# Patient Record
Sex: Male | Born: 1995 | Race: Black or African American | Marital: Single | Smoking: Never smoker
Health system: Southern US, Community
[De-identification: ages and names within clinical notes are randomized; demographics above are authoritative.]

## PROBLEM LIST (undated history)

## (undated) DIAGNOSIS — I10 Essential (primary) hypertension: Secondary | ICD-10-CM

---
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
## 2018-10-25 DIAGNOSIS — S62605A Fracture of unspecified phalanx of left ring finger, initial encounter for closed fracture: Secondary | ICD-10-CM | POA: Diagnosis not present

## 2018-10-29 DIAGNOSIS — S62605A Fracture of unspecified phalanx of left ring finger, initial encounter for closed fracture: Secondary | ICD-10-CM | POA: Diagnosis not present

## 2018-10-30 ENCOUNTER — Encounter: Payer: Self-pay | Admitting: Orthopedic Surgery

## 2018-10-30 ENCOUNTER — Other Ambulatory Visit: Payer: Self-pay

## 2018-10-30 ENCOUNTER — Ambulatory Visit (INDEPENDENT_AMBULATORY_CARE_PROVIDER_SITE_OTHER): Payer: Medicaid Other

## 2018-10-30 ENCOUNTER — Ambulatory Visit (INDEPENDENT_AMBULATORY_CARE_PROVIDER_SITE_OTHER): Payer: Medicaid Other | Admitting: Orthopedic Surgery

## 2018-10-30 VITALS — Temp 97.2°F | Resp 18 | Ht 68.0 in | Wt 277.0 lb

## 2018-10-30 DIAGNOSIS — S62655A Nondisplaced fracture of medial phalanx of left ring finger, initial encounter for closed fracture: Secondary | ICD-10-CM

## 2018-10-30 DIAGNOSIS — M79645 Pain in left finger(s): Secondary | ICD-10-CM | POA: Diagnosis not present

## 2018-10-30 NOTE — Patient Instructions (Addendum)
You will be scheduled to see a hand specialist for further management of this injury For pain you can use Tylenol or Advil over-the-counter Aleve is also appropriate

## 2018-10-30 NOTE — Progress Notes (Signed)
Jeremy Hughes  10/30/2018  HISTORY SECTION :  Chief Complaint  Patient presents with  . Finger Injury    left ring finger injury    The patient presents for evaluation of left ring finger injury approximately 4 weeks ago  The patient was playing basketball jammed his left ring finger presented for evaluation around 17 July however comes in for what was diagnosed as a middle phalanx fracture volar plate complaining of pain swelling and stiffness  Location left ring finger PIP joint Duration 1 month Quality dull Severity mild Associated with decreased extension pain at the PIP joint swelling of the PIP joint    Review of Systems  Constitutional: Positive for chills, fever and malaise/fatigue.  HENT: Positive for sore throat.   Respiratory: Positive for cough.   Neurological: Positive for dizziness and tingling.  All other systems reviewed and are negative.    History reviewed. No pertinent past medical history. No major medical problems History reviewed. No pertinent surgical history. No prior surgery  No Known Allergies   Current Outpatient Medications:  .  ibuprofen (ADVIL,MOTRIN) 600 MG tablet, Take 1 tablet (600 mg total) by mouth every 6 (six) hours as needed. (Patient not taking: Reported on 10/30/2018), Disp: 30 tablet, Rfl: 0   PHYSICAL EXAM SECTION: 1) Resp 18   Ht 5\' 8"  (1.727 m)   Wt 277 lb (125.6 kg)   BMI 42.12 kg/m   Body mass index is 42.12 kg/m. General appearance: Well-developed well-nourished no gross deformities  2) Cardiovascular normal pulse and perfusion  normal color without edema  3) Neurologically deep tendon reflexes are equal and normal, no sensation loss or deficits no pathologic reflexes  4) Psychological: Awake alert and oriented x3 mood and affect normal  5) Skin no lacerations or ulcerations no nodularity no palpable masses, no erythema or nodularity  6) Musculoskeletal:   Left ring finger is swollen at the PIP joint he is  lacking 30 degrees of extension he can flex it all the way but cannot make a tight fist is stable medial lateral anterior posterior is extension strength and flexion strength in terms of tendon function normal   MEDICAL DECISION SECTION:  Encounter Diagnosis  Name Primary?  . Pain in left finger(s) Yes    Imaging CD images are poor quality report was reviewed and was read as a volar plate injury of the PIP joint at the middle phalanx with surrounding soft tissue swelling  I repeated the x-ray fracture fragment is nondisplaced  Plan:  (Rx., Inj., surg., Frx, MRI/CT, XR:2)  Possible boutonniere type injury with central slip injury  Recommend referral to hand specialist   9:58 AM

## 2018-11-07 DIAGNOSIS — M25642 Stiffness of left hand, not elsewhere classified: Secondary | ICD-10-CM | POA: Diagnosis not present

## 2018-11-07 DIAGNOSIS — M79645 Pain in left finger(s): Secondary | ICD-10-CM | POA: Diagnosis not present

## 2018-11-07 DIAGNOSIS — S62625A Displaced fracture of medial phalanx of left ring finger, initial encounter for closed fracture: Secondary | ICD-10-CM | POA: Diagnosis not present

## 2018-11-13 DIAGNOSIS — M79645 Pain in left finger(s): Secondary | ICD-10-CM | POA: Diagnosis not present

## 2018-11-13 DIAGNOSIS — M25642 Stiffness of left hand, not elsewhere classified: Secondary | ICD-10-CM | POA: Diagnosis not present

## 2018-11-13 DIAGNOSIS — S62625A Displaced fracture of medial phalanx of left ring finger, initial encounter for closed fracture: Secondary | ICD-10-CM | POA: Diagnosis not present

## 2018-11-15 ENCOUNTER — Encounter: Payer: Self-pay | Admitting: Orthopedic Surgery

## 2018-11-27 ENCOUNTER — Ambulatory Visit: Payer: Medicaid Other | Admitting: Orthopedic Surgery

## 2018-11-27 DIAGNOSIS — M25642 Stiffness of left hand, not elsewhere classified: Secondary | ICD-10-CM | POA: Diagnosis not present

## 2018-11-27 DIAGNOSIS — M79645 Pain in left finger(s): Secondary | ICD-10-CM | POA: Diagnosis not present

## 2018-11-27 DIAGNOSIS — S62625A Displaced fracture of medial phalanx of left ring finger, initial encounter for closed fracture: Secondary | ICD-10-CM | POA: Diagnosis not present

## 2018-12-04 ENCOUNTER — Ambulatory Visit: Payer: Medicaid Other | Admitting: Orthopedic Surgery

## 2018-12-11 DIAGNOSIS — M25642 Stiffness of left hand, not elsewhere classified: Secondary | ICD-10-CM | POA: Diagnosis not present

## 2018-12-25 DIAGNOSIS — M25642 Stiffness of left hand, not elsewhere classified: Secondary | ICD-10-CM | POA: Diagnosis not present

## 2018-12-25 DIAGNOSIS — M79645 Pain in left finger(s): Secondary | ICD-10-CM | POA: Diagnosis not present

## 2018-12-25 DIAGNOSIS — S62625A Displaced fracture of medial phalanx of left ring finger, initial encounter for closed fracture: Secondary | ICD-10-CM | POA: Diagnosis not present

## 2018-12-30 DIAGNOSIS — M79645 Pain in left finger(s): Secondary | ICD-10-CM | POA: Diagnosis not present

## 2018-12-30 DIAGNOSIS — S62625A Displaced fracture of medial phalanx of left ring finger, initial encounter for closed fracture: Secondary | ICD-10-CM | POA: Diagnosis not present

## 2018-12-30 DIAGNOSIS — M25642 Stiffness of left hand, not elsewhere classified: Secondary | ICD-10-CM | POA: Diagnosis not present

## 2019-01-30 DIAGNOSIS — S62625A Displaced fracture of medial phalanx of left ring finger, initial encounter for closed fracture: Secondary | ICD-10-CM | POA: Diagnosis not present

## 2019-01-30 DIAGNOSIS — M79645 Pain in left finger(s): Secondary | ICD-10-CM | POA: Diagnosis not present

## 2019-04-18 ENCOUNTER — Ambulatory Visit
Admission: EM | Admit: 2019-04-18 | Discharge: 2019-04-18 | Disposition: A | Discharge status: Home / Self Care | Payer: Medicaid Other | Attending: Emergency Medicine | Admitting: Emergency Medicine

## 2019-04-18 ENCOUNTER — Other Ambulatory Visit: Payer: Self-pay

## 2019-04-18 DIAGNOSIS — Z0189 Encounter for other specified special examinations: Secondary | ICD-10-CM

## 2019-04-18 NOTE — Discharge Instructions (Signed)

## 2019-04-18 NOTE — ED Provider Notes (Signed)
[facility] CARE CENTER   [id number] 04/18/19 Arrival Time: 0941   CC: COVID test  SUBJECTIVE: History from: patient.  Vikram I Kovaleski is a 24 y.o. male who presents for COVID testing.  Denies sick exposure to COVID, flu or strep.  Denies recent travel.  Denies aggravating or alleviating symptoms.  Denies previous COVID infection.   Denies fever, chills, fatigue, nasal congestion, rhinorrhea, sore throat, cough, SOB, wheezing, chest pain, nausea, vomiting, changes in bowel or bladder habits.    ROS: As per HPI.  All other pertinent ROS negative.     History reviewed. No pertinent past medical history. History reviewed. No pertinent surgical history. No Known Allergies No current facility-administered medications on file prior to encounter.   No current outpatient medications on file prior to encounter.   Social History   Socioeconomic History  . Marital status: Single    Spouse name: Not on file  . Number of children: Not on file  . Years of education: Not on file  . Highest education level: Not on file  Occupational History  . Not on file  Tobacco Use  . Smoking status: Never Smoker  . Smokeless tobacco: Never Used  Substance and Sexual Activity  . Alcohol use: No  . Drug use: No  . Sexual activity: Not on file  Other Topics Concern  . Not on file  Social History Narrative  . Not on file   Social Determinants of Health   Financial Resource Strain:   . Difficulty of Paying Living Expenses: Not on file  Food Insecurity:   . Worried About Programme researcher, broadcasting/film/video in the Last Year: Not on file  . Ran Out of Food in the Last Year: Not on file  Transportation Needs:   . Lack of Transportation (Medical): Not on file  . Lack of Transportation (Non-Medical): Not on file  Physical Activity:   . Days of Exercise per Week: Not on file  . Minutes of Exercise per Session: Not on file  Stress:   . Feeling of Stress : Not on file  Social Connections:   . Frequency of  Communication with Friends and Family: Not on file  . Frequency of Social Gatherings with Friends and Family: Not on file  . Attends Religious Services: Not on file  . Active Member of Clubs or Organizations: Not on file  . Attends Banker Meetings: Not on file  . Marital Status: Not on file  Intimate Partner Violence:   . Fear of Current or Ex-Partner: Not on file  . Emotionally Abused: Not on file  . Physically Abused: Not on file  . Sexually Abused: Not on file   Family History  Problem Relation Age of Onset  . Healthy Mother   . Healthy Father   . Diabetes Maternal Aunt   . Hypertension Maternal Aunt   . Hypertension Maternal Grandmother   . Diabetes Maternal Grandmother   . Hypertension Maternal Grandfather     OBJECTIVE:  Vitals:   04/18/19 1001  BP: 127/87  Pulse: 91  Resp: 18  Temp: 98 F (36.7 C)  SpO2: 96%     General appearance: alert; well-appearing, nontoxic; speaking in full sentences and tolerating own secretions HEENT: NCAT; Ears: EACs clear, TMs pearly gray; Eyes: PERRL.  EOM grossly intact.  Nose: nares patent without rhinorrhea, Throat: oropharynx clear, tonsils non erythematous or enlarged, uvula midline  Neck: supple without LAD Lungs: unlabored respirations, symmetrical air entry; cough: absent; no respiratory distress; CTAB Heart:  regular rate and rhythm.   Skin: warm and dry Psychological: alert and cooperative; normal mood and affect  ASSESSMENT & PLAN:  1. Patient request for diagnostic testing     No orders of the defined types were placed in this encounter.  COVID testing ordered.  It will take between 5-7 days for test results.  Someone will contact you regarding abnormal results.    In the meantime: You should remain isolated in your home for 10 days from symptom onset AND greater than 72 hours after symptoms resolution (absence of fever without the use of fever-reducing medication and improvement in respiratory  symptoms), whichever is longer OR 14 days from exposure Get plenty of rest and push fluids Use OTC zyrtec for nasal congestion, runny nose, and/or sore throat Use OTC flonase for nasal congestion and runny nose Use medications daily for symptom relief Use OTC medications like ibuprofen or tylenol as needed fever or pain Call or go to the ED if you have any new or worsening symptoms such as fever, cough, shortness of breath, chest tightness, chest pain, turning blue, changes in mental status, etc...   Reviewed expectations re: course of current medical issues. Questions answered. Outlined signs and symptoms indicating need for more acute intervention. Patient verbalized understanding. After Visit Summary given.         Lestine Box, PA-C 04/18/19 1028

## 2019-04-18 NOTE — ED Triage Notes (Signed)
Pt presents with request for COVID test for school. Denies any symptoms or exposure to COVID. Pt denies any other complaints.

## 2019-04-20 LAB — NOVEL CORONAVIRUS, NAA

## 2019-04-21 ENCOUNTER — Ambulatory Visit
Admission: EM | Admit: 2019-04-21 | Discharge: 2019-04-21 | Disposition: A | Discharge status: Home / Self Care | Payer: Medicaid Other | Attending: Emergency Medicine | Admitting: Emergency Medicine

## 2019-04-21 ENCOUNTER — Encounter (HOSPITAL_COMMUNITY): Payer: Self-pay

## 2019-04-21 ENCOUNTER — Telehealth (HOSPITAL_COMMUNITY): Payer: Self-pay | Admitting: Emergency Medicine

## 2019-04-21 DIAGNOSIS — Z711 Person with feared health complaint in whom no diagnosis is made: Secondary | ICD-10-CM

## 2019-04-21 LAB — POC SARS CORONAVIRUS 2 AG -  ED: SARS Coronavirus 2 Ag: NEGATIVE

## 2019-04-21 NOTE — Telephone Encounter (Signed)

## 2019-04-21 NOTE — ED Triage Notes (Signed)
pts covid test inclonclusive and needs retesting for college admission tomorrow. Pt has not had symptoms

## 2019-10-15 ENCOUNTER — Encounter: Payer: Self-pay | Admitting: Emergency Medicine

## 2019-10-15 ENCOUNTER — Other Ambulatory Visit: Payer: Self-pay

## 2019-10-15 ENCOUNTER — Ambulatory Visit
Admission: EM | Admit: 2019-10-15 | Discharge: 2019-10-15 | Disposition: A | Discharge status: Home / Self Care | Payer: Medicaid Other | Attending: Emergency Medicine | Admitting: Emergency Medicine

## 2019-10-15 DIAGNOSIS — M25511 Pain in right shoulder: Secondary | ICD-10-CM | POA: Diagnosis not present

## 2019-10-15 DIAGNOSIS — M545 Low back pain, unspecified: Secondary | ICD-10-CM

## 2019-10-15 MED ORDER — CYCLOBENZAPRINE HCL 5 MG PO TABS: 5.0000 mg | ORAL_TABLET | Freq: Three times a day (TID) | ORAL | 0 refills | Status: DC | PRN

## 2019-10-15 MED ORDER — PREDNISONE 10 MG PO TABS: 20.0000 mg | ORAL_TABLET | Freq: Every day | ORAL | 0 refills | Status: DC

## 2019-10-15 MED ORDER — KETOROLAC TROMETHAMINE 30 MG/ML IJ SOLN
30.0000 mg | Freq: Once | INTRAMUSCULAR | Status: AC
  Administered 2019-10-15: 30 mg via INTRAMUSCULAR

## 2019-10-15 MED ORDER — METHYLPREDNISOLONE SODIUM SUCC 125 MG IJ SOLR
80.0000 mg | Freq: Once | INTRAMUSCULAR | Status: AC
  Administered 2019-10-15: 80 mg via INTRAMUSCULAR

## 2019-10-15 MED ORDER — ACETAMINOPHEN 500 MG PO TABS: 500.0000 mg | ORAL_TABLET | Freq: Four times a day (QID) | ORAL | 0 refills | Status: DC | PRN

## 2019-10-15 NOTE — ED Provider Notes (Addendum)
[facility] CARE CENTER   [id number] 10/15/19 Arrival Time: 1605   Chief Complaint  Patient presents with  . Shoulder Pain    right     SUBJECTIVE: History from: patient.  Jeremy Hughes is a 24 y.o. male who presented to the urgent care with a complaint of right shoulder and low back pain for the past 2 weeks.  Reported he has been lifting heavy weight at  the gym.  He localizes the pain to the right shoulder and low back.  He describes the pain as constant and achy.  Has not tried any OTC medication.Marland Kitchen  His symptoms are made worse with ROM.  He denies similar symptoms in the past.  Denies chills, fever, nausea, vomiting, diarrhea   ROS: As per HPI.  All other pertinent ROS negative.     History reviewed. No pertinent past medical history. History reviewed. No pertinent surgical history. No Known Allergies No current facility-administered medications on file prior to encounter.   No current outpatient medications on file prior to encounter.   Social History   Socioeconomic History  . Marital status: Single    Spouse name: Not on file  . Number of children: Not on file  . Years of education: Not on file  . Highest education level: Not on file  Occupational History  . Not on file  Tobacco Use  . Smoking status: Never Smoker  . Smokeless tobacco: Never Used  Substance and Sexual Activity  . Alcohol use: No  . Drug use: No  . Sexual activity: Not on file  Other Topics Concern  . Not on file  Social History Narrative  . Not on file   Social Determinants of Health   Financial Resource Strain:   . Difficulty of Paying Living Expenses:   Food Insecurity:   . Worried About Programme researcher, broadcasting/film/video in the Last Year:   . Barista in the Last Year:   Transportation Needs:   . Freight forwarder (Medical):   Marland Kitchen Lack of Transportation (Non-Medical):   Physical Activity:   . Days of Exercise per Week:   . Minutes of Exercise per Session:   Stress:   . Feeling of  Stress :   Social Connections:   . Frequency of Communication with Friends and Family:   . Frequency of Social Gatherings with Friends and Family:   . Attends Religious Services:   . Active Member of Clubs or Organizations:   . Attends Banker Meetings:   Marland Kitchen Marital Status:   Intimate Partner Violence:   . Fear of Current or Ex-Partner:   . Emotionally Abused:   Marland Kitchen Physically Abused:   . Sexually Abused:    Family History  Problem Relation Age of Onset  . Healthy Mother   . Healthy Father   . Diabetes Maternal Aunt   . Hypertension Maternal Aunt   . Hypertension Maternal Grandmother   . Diabetes Maternal Grandmother   . Hypertension Maternal Grandfather     OBJECTIVE:  Vitals:   10/15/19 1615 10/15/19 1616  BP: (!) 165/78   Resp: 18   Temp: 98.7 F (37.1 C)   TempSrc: Oral   SpO2: 96%   Weight:  260 lb (117.9 kg)     Physical Exam Vitals and nursing note reviewed.  Constitutional:      General: He is not in acute distress.    Appearance: Normal appearance. He is normal weight. He is not ill-appearing, toxic-appearing or diaphoretic.  Cardiovascular:     Rate and Rhythm: Normal rate and regular rhythm.     Pulses: Normal pulses.     Heart sounds: Normal heart sounds. No murmur heard.  No friction rub. No gallop.   Pulmonary:     Effort: Pulmonary effort is normal. No respiratory distress.     Breath sounds: Normal breath sounds. No stridor. No wheezing, rhonchi or rales.  Chest:     Chest wall: No tenderness.  Musculoskeletal:        General: Tenderness present.     Right shoulder: Tenderness present.     Left shoulder: Normal.     Lumbar back: Spasms and tenderness present.     Comments: The right shoulder is without any obvious asymmetry or deformity when compared to the left shoulder.  There is no surface trauma, ecchymosis, erythema,  open wound.  Range of motion is within normal limit with pain. Neurovascular status intact. Back:  Patient  ambulates from chair to exam table without difficulty.  Inspection: Skin clear and intact without obvious swelling, erythema, or ecchymosis. Warm to the touch  Palpation: Vertebral processes nontender. Tenderness about the low right back ROM: FROM Strength: 5/5 hip flexion, 5/5 knee extension, 5/5 knee flexion, 5/5 plantar flexion, 5/5 dorsiflexion  DTR: Patellar tendon reflex intact  Special Tests: Negative Straight leg raise  Neurological:     Mental Status: He is alert.     LABS:  No results found for this or any previous visit (from the past 24 hour(s)).   ASSESSMENT & PLAN:  1. Acute pain of right shoulder   2. Acute right-sided low back pain without sciatica     Meds ordered this encounter  Medications  . predniSONE (DELTASONE) 10 MG tablet    Sig: Take 2 tablets (20 mg total) by mouth daily.    Dispense:  15 tablet    Refill:  0  . cyclobenzaprine (FLEXERIL) 5 MG tablet    Sig: Take 1 tablet (5 mg total) by mouth 3 (three) times daily as needed for muscle spasms.    Dispense:  30 tablet    Refill:  0  . methylPREDNISolone sodium succinate (SOLU-MEDROL) 125 mg/2 mL injection 80 mg  . ketorolac (TORADOL) 30 MG/ML injection 30 mg  . acetaminophen (TYLENOL) 500 MG tablet    Sig: Take 1 tablet (500 mg total) by mouth every 6 (six) hours as needed.    Dispense:  30 tablet    Refill:  0    Discharge Instructions Rest, ice and heat as needed Ensure adequate ROM as tolerated. Take OTC Tylenol as needed for pain Prescribed prednisone for inflammation Prescribed flexeril  for muscle spasm.  Do not drive or operate heavy machinery while taking this medication Return here or go to ER if you have any new or worsening symptoms such as numbness/tingling of the inner thighs, loss of bladder or bowel control, headache/blurry vision, nausea/vomiting, confusion/altered mental status, dizziness, weakness, passing out, imbalance, etc...   Reviewed expectations re: course of current  medical issues. Questions answered. Outlined signs and symptoms indicating need for more acute intervention. Patient verbalized understanding. After Visit Summary given.      Note: This document was prepared using Dragon voice recognition software and may include unintentional dictation errors.    Durward Parcel, FNP 10/15/19 1627    Durward Parcel, FNP 10/15/19 1628    Durward Parcel, FNP 10/15/19 1631

## 2019-10-15 NOTE — ED Triage Notes (Signed)
Pts right shoulder has been having pain for 2 weeks.  Thinks it came from weight lighting.

## 2019-10-15 NOTE — Discharge Instructions (Addendum)
Rest, ice and heat as needed Ensure adequate ROM as tolerated. Take OTC Tylenol as needed for pain Prescribed prednisone for inflammation Prescribed flexeril  for muscle spasm.  Do not drive or operate heavy machinery while taking this medication Return here or go to ER if you have any new or worsening symptoms such as numbness/tingling of the inner thighs, loss of bladder or bowel control, headache/blurry vision, nausea/vomiting, confusion/altered mental status, dizziness, weakness, passing out, imbalance, etc..Marland Kitchen

## 2019-10-23 DIAGNOSIS — H5213 Myopia, bilateral: Secondary | ICD-10-CM | POA: Diagnosis not present

## 2019-11-02 ENCOUNTER — Other Ambulatory Visit: Payer: Self-pay

## 2019-11-02 ENCOUNTER — Ambulatory Visit
Admission: EM | Admit: 2019-11-02 | Discharge: 2019-11-02 | Disposition: A | Discharge status: Home / Self Care | Payer: Medicaid Other | Attending: Emergency Medicine | Admitting: Emergency Medicine

## 2019-11-02 DIAGNOSIS — M545 Low back pain, unspecified: Secondary | ICD-10-CM

## 2019-11-02 MED ORDER — DEXAMETHASONE SODIUM PHOSPHATE 10 MG/ML IJ SOLN
10.0000 mg | Freq: Once | INTRAMUSCULAR | Status: AC
  Administered 2019-11-02: 10 mg via INTRAMUSCULAR

## 2019-11-02 MED ORDER — MELOXICAM 15 MG PO TABS: 15.0000 mg | ORAL_TABLET | Freq: Every day | ORAL | 0 refills | Status: DC

## 2019-11-02 MED ORDER — TIZANIDINE HCL 2 MG PO CAPS: 2.0000 mg | ORAL_CAPSULE | Freq: Three times a day (TID) | ORAL | 0 refills | Status: DC | PRN

## 2019-11-02 NOTE — Discharge Instructions (Signed)
Decadron shot given in office Continue conservative management of rest, ice, and gentle stretches/ massage Take mobic as needed for pain relief (may cause abdominal discomfort, ulcers, and GI bleeds avoid taking with other NSAIDs) Take tizanidine at nighttime for symptomatic relief. Avoid driving or operating heavy machinery while using medication. Follow up with orthopedist for further evaluation and management Return or go to the ER if you have any new or worsening symptoms (fever, chills, chest pain, abdominal pain, changes in bowel or bladder habits, pain radiating into lower legs, etc...)

## 2019-11-02 NOTE — ED Provider Notes (Signed)
[facility]   [id number] 11/02/19 Arrival Time: 1317  CC: Back PAIN  SUBJECTIVE: History from: patient. Griffyn I Schwan is a 24 y.o. male complains of low back pain x 1.5 months.  Symptoms began after performing a dead lift with weights.  Localizes the pain to the low back.  Describes the pain as intermittent and sharp in character.  Was treated with prednisone and flexeril with temporary relief.  Symptoms are made worse with weight-lifting.  Denies similar symptoms in the past.  Denies fever, chills, erythema, ecchymosis, effusion, weakness, numbness and tingling, saddle paresthesias, loss of bowel or bladder function.      ROS: As per HPI.  All other pertinent ROS negative.     History reviewed. No pertinent past medical history. History reviewed. No pertinent surgical history. No Known Allergies No current facility-administered medications on file prior to encounter.   Current Outpatient Medications on File Prior to Encounter  Medication Sig Dispense Refill  . acetaminophen (TYLENOL) 500 MG tablet Take 1 tablet (500 mg total) by mouth every 6 (six) hours as needed. 30 tablet 0  . cyclobenzaprine (FLEXERIL) 5 MG tablet Take 1 tablet (5 mg total) by mouth 3 (three) times daily as needed for muscle spasms. 30 tablet 0   Social History   Socioeconomic History  . Marital status: Single    Spouse name: Not on file  . Number of children: Not on file  . Years of education: Not on file  . Highest education level: Not on file  Occupational History  . Not on file  Tobacco Use  . Smoking status: Never Smoker  . Smokeless tobacco: Never Used  Substance and Sexual Activity  . Alcohol use: No  . Drug use: No  . Sexual activity: Not on file  Other Topics Concern  . Not on file  Social History Narrative  . Not on file   Social Determinants of Health   Financial Resource Strain:   . Difficulty of Paying Living Expenses:   Food Insecurity:   . Worried About Brewing technologist in the Last Year:   . Barista in the Last Year:   Transportation Needs:   . Freight forwarder (Medical):   Marland Kitchen Lack of Transportation (Non-Medical):   Physical Activity:   . Days of Exercise per Week:   . Minutes of Exercise per Session:   Stress:   . Feeling of Stress :   Social Connections:   . Frequency of Communication with Friends and Family:   . Frequency of Social Gatherings with Friends and Family:   . Attends Religious Services:   . Active Member of Clubs or Organizations:   . Attends Banker Meetings:   Marland Kitchen Marital Status:   Intimate Partner Violence:   . Fear of Current or Ex-Partner:   . Emotionally Abused:   Marland Kitchen Physically Abused:   . Sexually Abused:    Family History  Problem Relation Age of Onset  . Healthy Mother   . Healthy Father   . Diabetes Maternal Aunt   . Hypertension Maternal Aunt   . Hypertension Maternal Grandmother   . Diabetes Maternal Grandmother   . Hypertension Maternal Grandfather     OBJECTIVE:  Vitals:   11/02/19 1328  BP: (!) 144/89  Pulse: 95  Resp: 18  Temp: 98.7 F (37.1 C)  TempSrc: Oral  SpO2: 95%    General appearance: ALERT; in no acute distress.  Head: NCAT Lungs: Normal respiratory  effort; CTAB CV: RRR Musculoskeletal: Back  Inspection: Skin warm, dry, clear and intact without obvious erythema, effusion, or ecchymosis.  Palpation: mildly TTP over low back ROM: FROM active and passive Strength: 5/5 shld abduction, 5/5 shld adduction, 5/5 elbow flexion, 5/5 elbow extension, 5/5 grip strength, 5/5 hip flexion, 5/5 hip extension Skin: warm and dry Neurologic: Ambulates without difficulty; Sensation intact about the upper/ lower extremities Psychological: alert and cooperative; normal mood and affect   ASSESSMENT & PLAN:  1. Acute bilateral low back pain without sciatica     Meds ordered this encounter  Medications  . meloxicam (MOBIC) 15 MG tablet    Sig: Take 1 tablet (15 mg total)  by mouth daily.    Dispense:  30 tablet    Refill:  0    Order Specific Question:   Supervising Provider    Answer:   Eustace Moore [[id number]]  . tizanidine (ZANAFLEX) 2 MG capsule    Sig: Take 1 capsule (2 mg total) by mouth 3 (three) times daily as needed for muscle spasms.    Dispense:  30 capsule    Refill:  0    Order Specific Question:   Supervising Provider    Answer:   Eustace Moore [[id number]]   Decadron shot given in office Continue conservative management of rest, ice, and gentle stretches/ massage Take mobic as needed for pain relief (may cause abdominal discomfort, ulcers, and GI bleeds avoid taking with other NSAIDs) Take tizanidine at nighttime for symptomatic relief. Avoid driving or operating heavy machinery while using medication. Follow up with orthopedist for further evaluation and management Return or go to the ER if you have any new or worsening symptoms (fever, chills, chest pain, abdominal pain, changes in bowel or bladder habits, pain radiating into lower legs, etc...)    Reviewed expectations re: course of current medical issues. Questions answered. Outlined signs and symptoms indicating need for more acute intervention. Patient verbalized understanding. After Visit Summary given.    Rennis Harding, PA-C 11/02/19 1409

## 2019-11-02 NOTE — ED Triage Notes (Signed)
Pt presents with continued recurring back pain X 3 weeks.

## 2019-11-20 ENCOUNTER — Ambulatory Visit
Admission: EM | Admit: 2019-11-20 | Discharge: 2019-11-20 | Disposition: A | Discharge status: Home / Self Care | Payer: Medicaid Other | Attending: Emergency Medicine | Admitting: Emergency Medicine

## 2019-11-20 ENCOUNTER — Other Ambulatory Visit: Payer: Self-pay

## 2019-11-20 DIAGNOSIS — Z1152 Encounter for screening for COVID-19: Secondary | ICD-10-CM

## 2019-11-20 NOTE — ED Triage Notes (Signed)
Pt here for covid screen prior to school admission

## 2019-11-21 LAB — SARS-COV-2, NAA 2 DAY TAT

## 2019-11-21 LAB — NOVEL CORONAVIRUS, NAA: SARS-CoV-2, NAA: NOT DETECTED

## 2019-12-17 ENCOUNTER — Ambulatory Visit (INDEPENDENT_AMBULATORY_CARE_PROVIDER_SITE_OTHER): Payer: Medicaid Other | Admitting: Orthopaedic Surgery

## 2019-12-17 ENCOUNTER — Other Ambulatory Visit: Payer: Self-pay

## 2019-12-17 ENCOUNTER — Encounter: Payer: Self-pay | Admitting: Orthopaedic Surgery

## 2019-12-17 ENCOUNTER — Ambulatory Visit (INDEPENDENT_AMBULATORY_CARE_PROVIDER_SITE_OTHER): Payer: Medicaid Other

## 2019-12-17 VITALS — Ht 68.0 in | Wt 260.0 lb

## 2019-12-17 DIAGNOSIS — G8929 Other chronic pain: Secondary | ICD-10-CM | POA: Diagnosis not present

## 2019-12-17 DIAGNOSIS — M545 Low back pain, unspecified: Secondary | ICD-10-CM

## 2019-12-17 NOTE — Progress Notes (Signed)
Office Visit Note   Patient: Jeremy Hughes           Date of Birth: 12-May-1995           MRN: [id number] Visit Date: 12/17/2019              Requested by: Johny Drilling, DO [address] [place],  [place] [address] PCP: Johny Drilling, DO   Assessment & Plan: Visit Diagnoses:  1. Chronic midline low back pain without sciatica   2. Chronic bilateral low back pain without sciatica     Plan: Chronic low back pain without radicular symptoms for the least the last 2 to 3 months.  Pain probably started after weight lifting.  Has not had any bowel or bladder changes.  He has been to the urgent care facility on several occasions with "2 shots" and given prescriptions for meloxicam and a muscle relaxant.  Now feeling a little better and not much compromise of his activities but still having trouble when he sleeps.  Has been performing exercises and notes it has not made much of a difference.  I suspect this is musculoligamentous ligamentous but he might have a pars defect causing a problem.  Will order MRI scan given the fact that he has had this for several months and has had medicine, exercises and limitation of activities  Follow-Up Instructions: Return After MRI scan lumbar spine.   Orders:  Orders Placed This Encounter  Procedures  . XR Lumbar Spine 2-3 Views   No orders of the defined types were placed in this encounter.     Procedures: No procedures performed   Clinical Data: No additional findings.   Subjective: Chief Complaint  Patient presents with  . Lower Back - Pain  Patient presents today for lower back pain. He said that it started hurting 2-44months ago. No known injury, but wonders if he strained it while lifting weights. His pain is located mid lower back. No pain radiates down either leg. No numbness, tingling, or weakness in his lower extremities. He did go to an urgent care and was given Meloxicam and Tizanidine, which did help. He states that  it flares up time to time. Currently his pain seems to only be present upon waking up in the morning after sleeping on his belly. No previous imaging. Has been to the urgent care facility and at least 2 occasions and was given several "shots".  He is on the above medicines that seem to make a little bit of a difference.  He does not have problems when he weight lifts he does cardio exercises but certainly has trouble sleeping and particularly when he first gets up in the morning.  No bowel or bladder changes.  No numbness or tingling.  HPI  Review of Systems   Objective: Vital Signs: Ht 5\' 8"  (1.727 m)   Wt 260 lb (117.9 kg)   BMI 39.53 kg/m   Physical Exam Constitutional:      Appearance: He is well-developed.  Eyes:     Pupils: Pupils are equal, round, and reactive to light.  Pulmonary:     Effort: Pulmonary effort is normal.  Skin:    General: Skin is warm and dry.  Neurological:     Mental Status: He is alert and oriented to person, place, and time.  Psychiatric:        Behavior: Behavior normal.     Ortho Exam awake alert and oriented x3.  Comfortable sitting.  Straight leg raise negative bilaterally.  Reflexes symmetrical.  Pelvis level.  No pain over either SI joint.  Painless range of motion both hips and both knees.  Motor and sensory exam intact.  Hamstrings are little bit tight with forward flexion but no pain with back extension. Specialty Comments:  No specialty comments available.  Imaging: XR Lumbar Spine 2-3 Views  Result Date: 12/17/2019 Films lumbar spine obtained in 2 projections.  There is less than a 5 degree lower lumbar curvature to the left.  Abundant bowel gas which obliterates some of the details.  SI joints appear to be intact.  No evidence of listhesis.  Disc spaces are well-maintained.  No obvious facet sclerosis.  No acute changes    PMFS History: Patient Active Problem List   Diagnosis Date Noted  . Low back pain 12/17/2019   History  reviewed. No pertinent past medical history.  Family History  Problem Relation Age of Onset  . Healthy Mother   . Healthy Father   . Diabetes Maternal Aunt   . Hypertension Maternal Aunt   . Hypertension Maternal Grandmother   . Diabetes Maternal Grandmother   . Hypertension Maternal Grandfather     History reviewed. No pertinent surgical history. Social History   Occupational History  . Not on file  Tobacco Use  . Smoking status: Never Smoker  . Smokeless tobacco: Never Used  Substance and Sexual Activity  . Alcohol use: No  . Drug use: No  . Sexual activity: Not on file

## 2019-12-17 NOTE — Addendum Note (Signed)
Addended by: Wendi Maya on: 12/17/2019 03:06 PM   Modules accepted: Orders

## 2020-03-31 ENCOUNTER — Other Ambulatory Visit: Payer: Self-pay

## 2020-03-31 ENCOUNTER — Ambulatory Visit (HOSPITAL_COMMUNITY)
Admission: RE | Admit: 2020-03-31 | Discharge: 2020-03-31 | Disposition: A | Discharge status: Home / Self Care | Payer: Medicaid Other | Source: Ambulatory Visit | Attending: Orthopaedic Surgery | Admitting: Orthopaedic Surgery

## 2020-03-31 DIAGNOSIS — M545 Low back pain, unspecified: Secondary | ICD-10-CM | POA: Insufficient documentation

## 2020-03-31 DIAGNOSIS — G8929 Other chronic pain: Secondary | ICD-10-CM | POA: Diagnosis not present

## 2020-04-13 ENCOUNTER — Ambulatory Visit: Payer: Medicaid Other | Admitting: Orthopaedic Surgery

## 2020-04-18 ENCOUNTER — Other Ambulatory Visit: Payer: Self-pay

## 2020-04-18 ENCOUNTER — Ambulatory Visit
Admission: EM | Admit: 2020-04-18 | Discharge: 2020-04-18 | Disposition: A | Discharge status: Home / Self Care | Payer: Medicaid Other | Attending: Family Medicine | Admitting: Family Medicine

## 2020-04-18 DIAGNOSIS — Z9189 Other specified personal risk factors, not elsewhere classified: Secondary | ICD-10-CM

## 2020-04-18 NOTE — ED Triage Notes (Signed)
Nurse visit only.  Wants covid test 

## 2020-04-22 LAB — NOVEL CORONAVIRUS, NAA: SARS-CoV-2, NAA: NOT DETECTED

## 2020-04-28 ENCOUNTER — Encounter: Payer: Self-pay | Admitting: Orthopaedic Surgery

## 2020-04-28 ENCOUNTER — Ambulatory Visit: Payer: Medicaid Other | Admitting: Orthopaedic Surgery

## 2020-04-28 VITALS — Ht 68.0 in | Wt 260.0 lb

## 2020-04-28 DIAGNOSIS — G8929 Other chronic pain: Secondary | ICD-10-CM

## 2020-04-28 DIAGNOSIS — M545 Low back pain, unspecified: Secondary | ICD-10-CM

## 2020-04-28 NOTE — Progress Notes (Signed)
Office Visit Note   Patient: Jeremy Hughes           Date of Birth: 04-15-1995           MRN: [id number] Visit Date: 04/28/2020              Requested by: Johny Drilling, DO [address] [place],  [place] [address] PCP: Johny Drilling, DO   Assessment & Plan: Visit Diagnoses:  1. Chronic bilateral low back pain without sciatica     Plan: Jeremy Hughes an MRI scan of his lumbar spine and December returns for further evaluation.  He is experiencing only back pain and particularly when he lifts weights.  He has not had any bowel or bladder changes or any radiculopathies.  The MRI scan demonstrated mild degenerative changes most prominent at L5-S1.  There was also a right L5-S1 paracentral protrusion with grazing of the exiting right L5 nerve root and mild bilateral neuroforaminal narrowing.  Long discussion regarding the results and answered any questions.  I think is fine to continue with weight lifting but would be worthwhile to have a session of physical therapy at [facility] in [place] where he lives.  He could be instructed on appropriate techniques.  I think this will make a big difference.  If he does not seem to improve over the next several months he may be a candidate for facet injections.  He will return if necessary  Follow-Up Instructions: Return if symptoms worsen or fail to improve.   Orders:  Orders Placed This Encounter  Procedures  . Ambulatory referral to Physical Therapy   No orders of the defined types were placed in this encounter.     Procedures: No procedures performed   Clinical Data: No additional findings.   Subjective: Chief Complaint  Patient presents with  . Lower Back - Follow-up    MRI review  Patient presents today for follow up on his lower back. He had an MRI performed on his L-Spine and is here today for those results.  No change in symptoms.  Not having any radicular symptoms and almost all low back pain.  Seems to be worse  when he is lifting weights.  No bowel or bladder changes  HPI  Review of Systems   Objective: Vital Signs: Ht 5\' 8"  (1.727 m)   Wt 260 lb (117.9 kg)   BMI 39.53 kg/m   Physical Exam Constitutional:      Appearance: He is well-developed and well-nourished.  HENT:     Mouth/Throat:     Mouth: Oropharynx is clear and moist.  Eyes:     Extraocular Movements: EOM normal.     Pupils: Pupils are equal, round, and reactive to light.  Pulmonary:     Effort: Pulmonary effort is normal.  Skin:    General: Skin is warm and dry.  Neurological:     Mental Status: He is alert and oriented to person, place, and time.  Psychiatric:        Mood and Affect: Mood and affect normal.        Behavior: Behavior normal.     Ortho Exam awake alert and oriented x3.  Comfortable sitting.  Walks without a limp.  No percussible tenderness of his lumbar spine.  Straight leg raise negative bilaterally.  No pain over either SI joint.  Motor and sensory exam intact.  Painless range of motion both hips and knees  Specialty Comments:  No specialty comments  available.  Imaging: No results found.   PMFS History: Patient Active Problem List   Diagnosis Date Noted  . Low back pain 12/17/2019   History reviewed. No pertinent past medical history.  Family History  Problem Relation Age of Onset  . Healthy Mother   . Healthy Father   . Diabetes Maternal Aunt   . Hypertension Maternal Aunt   . Hypertension Maternal Grandmother   . Diabetes Maternal Grandmother   . Hypertension Maternal Grandfather     History reviewed. No pertinent surgical history. Social History   Occupational History  . Not on file  Tobacco Use  . Smoking status: Never Smoker  . Smokeless tobacco: Never Used  Substance and Sexual Activity  . Alcohol use: No  . Drug use: No  . Sexual activity: Not on file

## 2021-09-23 ENCOUNTER — Other Ambulatory Visit: Payer: Self-pay

## 2021-09-23 ENCOUNTER — Emergency Department (HOSPITAL_COMMUNITY)
Admission: EM | Admit: 2021-09-23 | Discharge: 2021-09-23 | Disposition: A | Discharge status: Home / Self Care | Payer: Medicaid Other | Attending: Student | Admitting: Student

## 2021-09-23 ENCOUNTER — Encounter (HOSPITAL_COMMUNITY): Payer: Self-pay

## 2021-09-23 ENCOUNTER — Emergency Department (HOSPITAL_COMMUNITY): Payer: Medicaid Other

## 2021-09-23 DIAGNOSIS — R079 Chest pain, unspecified: Secondary | ICD-10-CM | POA: Diagnosis not present

## 2021-09-23 DIAGNOSIS — M5013 Cervical disc disorder with radiculopathy, cervicothoracic region: Secondary | ICD-10-CM | POA: Diagnosis not present

## 2021-09-23 DIAGNOSIS — R200 Anesthesia of skin: Secondary | ICD-10-CM | POA: Insufficient documentation

## 2021-09-23 DIAGNOSIS — R0602 Shortness of breath: Secondary | ICD-10-CM | POA: Diagnosis not present

## 2021-09-23 DIAGNOSIS — R0789 Other chest pain: Secondary | ICD-10-CM | POA: Diagnosis not present

## 2021-09-23 DIAGNOSIS — R7989 Other specified abnormal findings of blood chemistry: Secondary | ICD-10-CM

## 2021-09-23 LAB — CBC WITH DIFFERENTIAL/PLATELET
Abs Immature Granulocytes: 0.02 10*3/uL (ref 0.00–0.07)
Basophils Absolute: 0 10*3/uL (ref 0.0–0.1)
Basophils Relative: 1 %
Eosinophils Absolute: 0 10*3/uL (ref 0.0–0.5)
Eosinophils Relative: 0 %
HCT: 44 % (ref 39.0–52.0)
Hemoglobin: 15.7 g/dL (ref 13.0–17.0)
Immature Granulocytes: 0 %
Lymphocytes Relative: 25 %
Lymphs Abs: 2.1 10*3/uL (ref 0.7–4.0)
MCH: 29.8 pg (ref 26.0–34.0)
MCHC: 35.7 g/dL (ref 30.0–36.0)
MCV: 83.7 fL (ref 80.0–100.0)
Monocytes Absolute: 0.5 10*3/uL (ref 0.1–1.0)
Monocytes Relative: 6 %
Neutro Abs: 5.9 10*3/uL (ref 1.7–7.7)
Neutrophils Relative %: 68 %
Platelets: 269 10*3/uL (ref 150–400)
RBC: 5.26 MIL/uL (ref 4.22–5.81)
RDW: 12.2 % (ref 11.5–15.5)
WBC: 8.6 10*3/uL (ref 4.0–10.5)
nRBC: 0 % (ref 0.0–0.2)

## 2021-09-23 LAB — COMPREHENSIVE METABOLIC PANEL
ALT: 37 U/L (ref 0–44)
AST: 28 U/L (ref 15–41)
Albumin: 4.7 g/dL (ref 3.5–5.0)
Alkaline Phosphatase: 81 U/L (ref 38–126)
Anion gap: 6 (ref 5–15)
BUN: 16 mg/dL (ref 6–20)
CO2: 25 mmol/L (ref 22–32)
Calcium: 9.4 mg/dL (ref 8.9–10.3)
Chloride: 105 mmol/L (ref 98–111)
Creatinine, Ser: 1.41 mg/dL — ABNORMAL HIGH (ref 0.61–1.24)
GFR, Estimated: >60 mL/min (ref >60)
Glucose, Bld: 102 mg/dL — ABNORMAL HIGH (ref 70–99)
Potassium: 3.6 mmol/L (ref 3.5–5.1)
Sodium: 136 mmol/L (ref 135–145)
Total Bilirubin: 0.6 mg/dL (ref 0.3–1.2)
Total Protein: 8.9 g/dL — ABNORMAL HIGH (ref 6.5–8.1)

## 2021-09-23 LAB — TROPONIN I (HIGH SENSITIVITY)
Troponin I (High Sensitivity): 4 ng/L (ref <18)
Troponin I (High Sensitivity): 4 ng/L (ref <18)

## 2021-09-23 MED ORDER — LACTATED RINGERS IV BOLUS
1000.0000 mL | Freq: Once | INTRAVENOUS | Status: AC
  Administered 2021-09-23: 1000 mL via INTRAVENOUS

## 2021-09-23 NOTE — ED Triage Notes (Signed)
Patient c/o chest tightness and left arm pain since last night. Patient denies [facility]. States pain began after dinner.

## 2021-09-23 NOTE — ED Provider Notes (Signed)
[facility]-EMERGENCY DEPT Provider Note  CSN: [id number] Arrival date & time: 09/23/21 1638  Chief Complaint(s) No chief complaint on file.  HPI Jeremy Hughes is a 26 y.o. male who presents emergency department for evaluation of chest pain and left arm numbness.  Patient states that after eating fried chicken last night he had a episode of a crampy chest pain that radiated down his left arm with associated left arm numbness.  The symptoms have resolved upon presentation to the emergency department.  Patient states that they lasted for approximately 1 hour but he was worried about this and stayed up all night.  He denies associated shortness of breath, diaphoresis, nausea, vomiting or other systemic symptoms.  Denies alcohol or illicit substance use.  Patient arrives anxious and tachycardic.   Past Medical History History reviewed. No pertinent past medical history. Patient Active Problem List   Diagnosis Date Noted   Low back pain 12/17/2019   Home Medication(s) Prior to Admission medications   Medication Sig Start Date End Date Taking? Authorizing Provider  acetaminophen (TYLENOL) 500 MG tablet Take 1 tablet (500 mg total) by mouth every 6 (six) hours as needed. 10/15/19   Avegno, Zachery Dakins, FNP  cyclobenzaprine (FLEXERIL) 5 MG tablet Take 1 tablet (5 mg total) by mouth 3 (three) times daily as needed for muscle spasms. 10/15/19   Avegno, Zachery Dakins, FNP  meloxicam (MOBIC) 15 MG tablet Take 1 tablet (15 mg total) by mouth daily. 11/02/19   Wurst, [place], PA-C  tizanidine (ZANAFLEX) 2 MG capsule Take 1 capsule (2 mg total) by mouth 3 (three) times daily as needed for muscle spasms. 11/02/19   Rennis Harding, PA-C                                                                                                                                    Past Surgical History History reviewed. No pertinent surgical history. Family History Family History  Problem Relation Age of  Onset   Healthy Mother    Healthy Father    Diabetes Maternal Aunt    Hypertension Maternal Aunt    Hypertension Maternal Grandmother    Diabetes Maternal Grandmother    Hypertension Maternal Grandfather     Social History Social History   Tobacco Use   Smoking status: Never   Smokeless tobacco: Never  Substance Use Topics   Alcohol use: No   Drug use: No   Allergies Patient has no known allergies.  Review of Systems Review of Systems  Respiratory:  Positive for chest tightness.   Cardiovascular:  Positive for chest pain.  Neurological:  Positive for numbness.    Physical Exam Vital Signs  I have reviewed the triage vital signs BP (!) 186/112 (BP Location: Right Arm)   Pulse (!) 112   Temp 99.3 F (37.4 C) (Oral)   Resp 19   Ht 5\' 8"  (1.727 m)   Wt )  142.9 kg   SpO2 97%   BMI 47.90 kg/m   Physical Exam Constitutional:      General: He is not in acute distress.    Appearance: Normal appearance.  HENT:     Head: Normocephalic and atraumatic.     Nose: No congestion or rhinorrhea.  Eyes:     General:        Right eye: No discharge.        Left eye: No discharge.     Extraocular Movements: Extraocular movements intact.     Pupils: Pupils are equal, round, and reactive to light.  Cardiovascular:     Rate and Rhythm: Normal rate and regular rhythm.     Heart sounds: No murmur heard. Pulmonary:     Effort: No respiratory distress.     Breath sounds: No wheezing or rales.  Abdominal:     General: There is no distension.     Tenderness: There is no abdominal tenderness.  Musculoskeletal:        General: Normal range of motion.     Cervical back: Normal range of motion.  Skin:    General: Skin is warm and dry.  Neurological:     General: No focal deficit present.     Mental Status: He is alert.     Cranial Nerves: No cranial nerve deficit.     Sensory: No sensory deficit.     Motor: No weakness.     ED Results and Treatments Labs (all labs  ordered are listed, but only abnormal results are displayed) Labs Reviewed  COMPREHENSIVE METABOLIC PANEL  CBC WITH DIFFERENTIAL/PLATELET  TROPONIN I (HIGH SENSITIVITY)                                                                                                                          Radiology No results found.  Pertinent labs & imaging results that were available during my care of the patient were reviewed by me and considered in my medical decision making (see MDM for details).  Medications Ordered in ED Medications - No data to display                                                                                                                                   Procedures Procedures  (including critical care time)  Medical Decision Making / ED Course   This patient presents to the  ED for concern of chest pain, this involves an extensive number of treatment options, and is a complaint that carries with it a high risk of complications and morbidity.  The differential diagnosis includes GERD, anxiety, ACS, PE, costochondritis  MDM: Patient seen emergency room for evaluation of chest pain.  Physical exam reveals a regular tachycardia but is otherwise unremarkable.  Laboratory evaluation with a creatinine elevation to 1.41 but is otherwise unremarkable including a negative high-sensitivity troponin and delta troponin.  Chest x-ray unremarkable.  ECG with an incomplete left bundle branch block and sinus tachycardia but is otherwise negative for ischemic change.  Patient given fluid resuscitation and his tachycardia resolved.  Patient's heart score is 1 and he is safe for discharge with outpatient PCP follow-up.  Patient's Wells score is 1.5 placing him in a very low risk group for PE.  As the patient has no hypoxia or shortness of breath, I have low suspicion for pulmonary embolism at this time.  At this time, patient safe for discharge with outpatient follow-up but he was given return  precautions which he voiced understanding.   Additional history obtained:  -External records from outside source obtained and reviewed including: Chart review including previous notes, labs, imaging, consultation notes   Lab Tests: -I ordered, reviewed, and interpreted labs.   The pertinent results include:   Labs Reviewed  COMPREHENSIVE METABOLIC PANEL  CBC WITH DIFFERENTIAL/PLATELET  TROPONIN I (HIGH SENSITIVITY)      EKG   EKG Interpretation  Date/Time:  Friday September 23 2021 09:27:14 EDT Ventricular Rate:  100 PR Interval:  145 QRS Duration: 88 QT Interval:  330 QTC Calculation: 426 R Axis:   104 Text Interpretation: Sinus tachycardia Borderline right axis deviation incomplete LBBB Confirmed by Christl Fessenden ([id number]) on 09/23/2021 9:30:01 AM         Imaging Studies ordered: I ordered imaging studies including chest x-ray I independently visualized and interpreted imaging. I agree with the radiologist interpretation   Medicines ordered and prescription drug management: No orders of the defined types were placed in this encounter.   -I have reviewed the patients home medicines and have made adjustments as needed  Critical interventions none    Cardiac Monitoring: The patient was maintained on a cardiac monitor.  I personally viewed and interpreted the cardiac monitored which showed an underlying rhythm of: Sinus tachycardia, NSR  Social Determinants of Health:  Factors impacting patients care include: none   Reevaluation: After the interventions noted above, I reevaluated the patient and found that they have :improved  Co morbidities that complicate the patient evaluation History reviewed. No pertinent past medical history.    Dispostion: I considered admission for this patient, and with heart score of 1, Wells score 1.5 and chest pain resolved, patient safe for discharge with outpatient follow-up.  Patient does not meet inpatient criteria for hospital  admission.     Final Clinical Impression(s) / ED Diagnoses Final diagnoses:  None     @PCDICTATION @    , MD 09/23/21 1410

## 2021-11-13 DIAGNOSIS — R0789 Other chest pain: Secondary | ICD-10-CM | POA: Diagnosis not present

## 2021-11-13 DIAGNOSIS — R079 Chest pain, unspecified: Secondary | ICD-10-CM | POA: Diagnosis not present

## 2021-12-03 ENCOUNTER — Encounter (HOSPITAL_COMMUNITY): Payer: Self-pay

## 2021-12-03 ENCOUNTER — Emergency Department (HOSPITAL_COMMUNITY)
Admission: EM | Admit: 2021-12-03 | Discharge: 2021-12-03 | Disposition: A | Discharge status: Home / Self Care | Payer: Medicaid Other | Attending: Emergency Medicine | Admitting: Emergency Medicine

## 2021-12-03 ENCOUNTER — Emergency Department (HOSPITAL_COMMUNITY): Payer: Medicaid Other

## 2021-12-03 DIAGNOSIS — R0789 Other chest pain: Secondary | ICD-10-CM | POA: Diagnosis not present

## 2021-12-03 DIAGNOSIS — K219 Gastro-esophageal reflux disease without esophagitis: Secondary | ICD-10-CM | POA: Insufficient documentation

## 2021-12-03 DIAGNOSIS — R079 Chest pain, unspecified: Secondary | ICD-10-CM | POA: Diagnosis present

## 2021-12-03 LAB — CBC
HCT: 44.4 % (ref 39.0–52.0)
Hemoglobin: 15.3 g/dL (ref 13.0–17.0)
MCH: 30 pg (ref 26.0–34.0)
MCHC: 34.5 g/dL (ref 30.0–36.0)
MCV: 87.1 fL (ref 80.0–100.0)
Platelets: 255 10*3/uL (ref 150–400)
RBC: 5.1 MIL/uL (ref 4.22–5.81)
RDW: 12.7 % (ref 11.5–15.5)
WBC: 6 10*3/uL (ref 4.0–10.5)
nRBC: 0 % (ref 0.0–0.2)

## 2021-12-03 LAB — BASIC METABOLIC PANEL
Anion gap: 8 (ref 5–15)
BUN: 16 mg/dL (ref 6–20)
CO2: 23 mmol/L (ref 22–32)
Calcium: 9.4 mg/dL (ref 8.9–10.3)
Chloride: 109 mmol/L (ref 98–111)
Creatinine, Ser: 1.33 mg/dL — ABNORMAL HIGH (ref 0.61–1.24)
GFR, Estimated: >60 mL/min (ref >60)
Glucose, Bld: 101 mg/dL — ABNORMAL HIGH (ref 70–99)
Potassium: 3.9 mmol/L (ref 3.5–5.1)
Sodium: 140 mmol/L (ref 135–145)

## 2021-12-03 LAB — TROPONIN I (HIGH SENSITIVITY)
Troponin I (High Sensitivity): 3 ng/L (ref <18)
Troponin I (High Sensitivity): 3 ng/L (ref <18)

## 2021-12-03 MED ORDER — DICYCLOMINE HCL 10 MG/5ML PO SOLN
10.0000 mg | Freq: Once | ORAL | Status: AC
  Administered 2021-12-03: 10 mg via ORAL
  Filled 2021-12-03: qty 5

## 2021-12-03 MED ORDER — PANTOPRAZOLE SODIUM 20 MG PO TBEC: 20.0000 mg | DELAYED_RELEASE_TABLET | Freq: Every day | ORAL | 0 refills | Status: DC

## 2021-12-03 MED ORDER — ALUM & MAG HYDROXIDE-SIMETH 200-200-20 MG/5ML PO SUSP
30.0000 mL | Freq: Once | ORAL | Status: AC
  Administered 2021-12-03: 30 mL via ORAL
  Filled 2021-12-03: qty 30

## 2021-12-03 NOTE — ED Notes (Signed)
Patient given discharge instructions, all questions answered. Patient in possession of all belongings, directed to the discharge area  

## 2021-12-03 NOTE — ED Provider Notes (Signed)
[facility]-EMERGENCY DEPT Provider Note   CSN: [id number] Arrival date & time: 12/03/21  0806     History  Chief Complaint  Patient presents with   Chest Pain    Jeremy Hughes is a 26 y.o. male with no known past medical history presenting today with chest pain.  Reports it is right-sided and intermittent over the past week.  Says that sometimes he feels as though he has to force himself to belch which somewhat relieves the pain.  No palpitations or shortness of breath.  No cardiac history or history of GERD   Chest Pain Associated symptoms: no palpitations, no shortness of breath and no vomiting        Home Medications Prior to Admission medications   Medication Sig Start Date End Date Taking? Authorizing Provider  acetaminophen (TYLENOL) 500 MG tablet Take 1 tablet (500 mg total) by mouth every 6 (six) hours as needed. 10/15/19   Avegno, Zachery Dakins, FNP  cyclobenzaprine (FLEXERIL) 5 MG tablet Take 1 tablet (5 mg total) by mouth 3 (three) times daily as needed for muscle spasms. 10/15/19   Avegno, Zachery Dakins, FNP  meloxicam (MOBIC) 15 MG tablet Take 1 tablet (15 mg total) by mouth daily. 11/02/19   Wurst, [place], PA-C  tizanidine (ZANAFLEX) 2 MG capsule Take 1 capsule (2 mg total) by mouth 3 (three) times daily as needed for muscle spasms. 11/02/19   Wurst, [place], PA-C      Allergies    Patient has no known allergies.    Review of Systems   Review of Systems  Respiratory:  Negative for chest tightness and shortness of breath.   Cardiovascular:  Positive for chest pain. Negative for palpitations.  Gastrointestinal:  Negative for diarrhea and vomiting.    Physical Exam Updated Vital Signs BP (!) 152/108   Pulse 76   Temp 98.6 F (37 C) (Oral)   Resp 18   Ht 5\' 8"  (1.727 m)   Wt 122.9 kg   SpO2 95%   BMI 41.21 kg/m  Physical Exam Vitals and nursing note reviewed.  Constitutional:      General: He is not in acute distress.    Appearance:  Normal appearance. He is not ill-appearing.  HENT:     Head: Normocephalic and atraumatic.  Eyes:     General: No scleral icterus.    Conjunctiva/sclera: Conjunctivae normal.  Cardiovascular:     Rate and Rhythm: Normal rate and regular rhythm.  Pulmonary:     Effort: Pulmonary effort is normal. No respiratory distress.     Breath sounds: Normal breath sounds.  Chest:     Chest wall: No tenderness.  Abdominal:     Palpations: Abdomen is soft.     Tenderness: There is no abdominal tenderness.  Skin:    General: Skin is warm and dry.     Findings: No rash.  Neurological:     Mental Status: He is alert.  Psychiatric:        Mood and Affect: Mood normal.     ED Results / Procedures / Treatments   Labs (all labs ordered are listed, but only abnormal results are displayed) Labs Reviewed  BASIC METABOLIC PANEL - Abnormal; Notable for the following components:      Result Value   Glucose, Bld 101 (*)    Creatinine, Ser 1.33 (*)    All other components within normal limits  CBC  TROPONIN I (HIGH SENSITIVITY)  TROPONIN I (HIGH SENSITIVITY)  EKG None  Radiology DG Chest 2 View  Result Date: 12/03/2021 CLINICAL DATA:  26 year old male with history of intermittent right-sided chest pain for the past week. EXAM: CHEST - 2 VIEW COMPARISON:  Chest x-ray 09/23/2021. FINDINGS: Lung volumes are normal. No consolidative airspace disease. No pleural effusions. No pneumothorax. No pulmonary nodule or mass noted. Pulmonary vasculature and the cardiomediastinal silhouette are within normal limits. IMPRESSION: No radiographic evidence of acute cardiopulmonary disease. Electronically Signed   By: Trudie Reed M.D.   On: 12/03/2021 08:59    Procedures Procedures   Medications Ordered in ED Medications  alum & mag hydroxide-simeth (MAALOX/MYLANTA) 200-200-20 MG/5ML suspension 30 mL (30 mLs Oral Given 12/03/21 1252)  dicyclomine (BENTYL) 10 MG/5ML solution 10 mg (10 mg Oral Given  12/03/21 1320)    ED Course/ Medical Decision Making/ A&P Clinical Course as of 12/03/21 1544  Sat Dec 03, 2021  1150 DG Chest 2 View [MR]    Clinical Course User Index [MR] Tjay Velazquez, Gabriel Cirri, PA-C                           Medical Decision Making Amount and/or Complexity of Data Reviewed Labs: ordered. Radiology: ordered. Decision-making details documented in ED Course.  Risk OTC drugs. Prescription drug management.   This patient presents to the ED for concern of chest pain. The emergent differential diagnosis of chest pain includes: Acute coronary syndrome, pericarditis, aortic dissection, pulmonary embolism, tension pneumothorax, and esophageal rupture.   This is not an exhaustive differential.    Past Medical History / Co-morbidities / Social History: N/A   Additional history: Patient presented similarly a few months ago.  Had a negative work-up at that time as well.   Physical Exam: Pertinent physical exam findings include No pertinent findings on physical exam.  RRR, lung sounds clear  Lab Tests: I ordered, and personally interpreted labs.  The pertinent results include: Trop -x2   Imaging Studies: I ordered and independently visualized and interpreted chest x-ray with no abnormalities.  I agree with radiologist.   Cardiac Monitoring:  The patient was maintained on a cardiac monitor.  Remained in normal sinus   Medications: I ordered medication including GI cocktail. Reevaluation of the patient after these medicines showed that the patient resolved. I have reviewed the patients home medicines and have made adjustments as needed.   Disposition: This is a 26 year old male who presented with right-sided chest pain.  Intermittent and relieved with belching.  Heart score 1.  PERC negative.  I have a very low suspicion of any cardiopulmonary etiology of his symptoms.  Believe it is more likely reflux as it is relieved with belching.  We will start him on PPI.  He  is agreeable to discharge at this time.  We will follow-up with gastroenterology and speak with the PCP about recurrent symptoms.  Return precautions discussed.    Final Clinical Impression(s) / ED Diagnoses Final diagnoses:  Gastroesophageal reflux disease, unspecified whether esophagitis present    Rx / DC Orders ED Discharge Orders          Ordered    pantoprazole (PROTONIX) 20 MG tablet  Daily        12/03/21 1211    pantoprazole (PROTONIX) 20 MG tablet  Daily        12/03/21 1310           Results and diagnoses were explained to the patient. Return precautions discussed in full. Patient had  no additional questions and expressed complete understanding.   This chart was dictated using voice recognition software.  Despite best efforts to proofread,  errors can occur which can change the documentation meaning.    Saddie Benders, PA-C 12/03/21 1545    Phoebe Sharps, DO 12/03/21 1552

## 2021-12-03 NOTE — ED Provider Triage Note (Signed)
Emergency Medicine Provider Triage Evaluation Note  Jeremy Hughes , a 26 y.o. male  was evaluated in triage.  Pt complains of right-sided chest pain.  Reports has been intermittent over the last week.  Forces himself to burp and sometimes this alleviates it.  No history of reflux.  No history of ACS  Review of Systems  Positive: Right-sided chest pain Negative: Shortness of breath, palpitations or back pain  Physical Exam  BP (!) 173/114   Pulse 86   Temp 98.8 F (37.1 C) (Oral)   Resp 18   Ht 5\' 8"  (1.727 m)   Wt 122.9 kg   SpO2 97%   BMI 41.21 kg/m  Gen:   Awake, no distress   Resp:  Normal effort  MSK:   Moves extremities without difficulty  Other:  No epigastric tenderness, no reproducible chest wall tenderness.  RRR, lung sounds clear  Medical Decision Making  Medically screening exam initiated at 10:01 AM.  Appropriate orders placed.  Leeandre I Mazzuca was informed that the remainder of the evaluation will be completed by another provider, this initial triage assessment does not replace that evaluation, and the importance of remaining in the ED until their evaluation is complete.     Lorenda Hatchet, PA-C 12/03/21 1003

## 2021-12-03 NOTE — ED Triage Notes (Signed)
Pt presents with c/o chest pain on the left side of his chest that started approx one week ago. Pt reports the pain comes and goes.

## 2021-12-03 NOTE — Discharge Instructions (Addendum)
I have sent in a medication for acid reflux to your pharmacy.  Your cardiac work-up is normal today.  Gastroenterology offices are attached to these papers.  Please call to establish care and further discuss your symptoms.  Return with any worsening symptoms, it was a pleasure to meet you and I hope that you feel better.

## 2022-01-13 DIAGNOSIS — I10 Essential (primary) hypertension: Secondary | ICD-10-CM | POA: Diagnosis not present

## 2022-01-13 DIAGNOSIS — R079 Chest pain, unspecified: Secondary | ICD-10-CM | POA: Diagnosis not present

## 2022-01-14 DIAGNOSIS — R079 Chest pain, unspecified: Secondary | ICD-10-CM | POA: Diagnosis not present

## 2022-02-17 DIAGNOSIS — M545 Low back pain, unspecified: Secondary | ICD-10-CM | POA: Diagnosis not present

## 2022-02-17 DIAGNOSIS — Z6841 Body Mass Index (BMI) 40.0 and over, adult: Secondary | ICD-10-CM | POA: Diagnosis not present

## 2022-02-17 DIAGNOSIS — R030 Elevated blood-pressure reading, without diagnosis of hypertension: Secondary | ICD-10-CM | POA: Diagnosis not present

## 2022-02-20 ENCOUNTER — Emergency Department (HOSPITAL_COMMUNITY)
Admission: EM | Admit: 2022-02-20 | Discharge: 2022-02-20 | Disposition: A | Discharge status: Home / Self Care | Payer: Medicaid Other | Attending: Emergency Medicine | Admitting: Emergency Medicine

## 2022-02-20 ENCOUNTER — Encounter (HOSPITAL_COMMUNITY): Payer: Self-pay

## 2022-02-20 ENCOUNTER — Other Ambulatory Visit: Payer: Self-pay

## 2022-02-20 ENCOUNTER — Emergency Department (HOSPITAL_COMMUNITY): Payer: Medicaid Other

## 2022-02-20 DIAGNOSIS — M545 Low back pain, unspecified: Secondary | ICD-10-CM | POA: Diagnosis not present

## 2022-02-20 DIAGNOSIS — M5459 Other low back pain: Secondary | ICD-10-CM | POA: Diagnosis not present

## 2022-02-20 HISTORY — DX: Essential (primary) hypertension: I10

## 2022-02-20 LAB — CBC WITH DIFFERENTIAL/PLATELET
Abs Immature Granulocytes: 0.02 10*3/uL (ref 0.00–0.07)
Basophils Absolute: 0.1 10*3/uL (ref 0.0–0.1)
Basophils Relative: 1 %
Eosinophils Absolute: 0.1 10*3/uL (ref 0.0–0.5)
Eosinophils Relative: 1 %
HCT: 44.4 % (ref 39.0–52.0)
Hemoglobin: 15.1 g/dL (ref 13.0–17.0)
Immature Granulocytes: 0 %
Lymphocytes Relative: 32 %
Lymphs Abs: 2.2 10*3/uL (ref 0.7–4.0)
MCH: 30.3 pg (ref 26.0–34.0)
MCHC: 34 g/dL (ref 30.0–36.0)
MCV: 89 fL (ref 80.0–100.0)
Monocytes Absolute: 0.5 10*3/uL (ref 0.1–1.0)
Monocytes Relative: 7 %
Neutro Abs: 4.1 10*3/uL (ref 1.7–7.7)
Neutrophils Relative %: 59 %
RBC: 4.99 MIL/uL (ref 4.22–5.81)
RDW: 12.8 % (ref 11.5–15.5)
Smear Review: ADEQUATE
WBC: 7 10*3/uL (ref 4.0–10.5)
nRBC: 0 % (ref 0.0–0.2)

## 2022-02-20 LAB — URINALYSIS, ROUTINE W REFLEX MICROSCOPIC
Bilirubin Urine: NEGATIVE
Glucose, UA: NEGATIVE mg/dL
Hgb urine dipstick: NEGATIVE
Ketones, ur: NEGATIVE mg/dL
Leukocytes,Ua: NEGATIVE
Nitrite: NEGATIVE
Protein, ur: NEGATIVE mg/dL
Specific Gravity, Urine: 1.021 (ref 1.005–1.030)
pH: 5 (ref 5.0–8.0)

## 2022-02-20 LAB — BASIC METABOLIC PANEL
Anion gap: 7 (ref 5–15)
BUN: 11 mg/dL (ref 6–20)
CO2: 25 mmol/L (ref 22–32)
Calcium: 9.1 mg/dL (ref 8.9–10.3)
Chloride: 105 mmol/L (ref 98–111)
Creatinine, Ser: 1.19 mg/dL (ref 0.61–1.24)
GFR, Estimated: >60 mL/min (ref >60)
Glucose, Bld: 88 mg/dL (ref 70–99)
Potassium: 4.2 mmol/L (ref 3.5–5.1)
Sodium: 137 mmol/L (ref 135–145)

## 2022-02-20 MED ORDER — LIDOCAINE 5 % EX PTCH
1.0000 | MEDICATED_PATCH | Freq: Once | CUTANEOUS | Status: DC
  Administered 2022-02-20: 1 via TRANSDERMAL
  Filled 2022-02-20: qty 1

## 2022-02-20 NOTE — Discharge Instructions (Signed)
It was a pleasure taking care of you today!  Your labs did not show any concerning findings today.  Your urine was unremarkable.  Your x-ray did not show any concerning findings.  You may take over-the-counter 600 mg ibuprofen every 6 hours and alternate with 500 mg Tylenol every 6 hours as needed for pain for no more than 7 days.  You may place ice or heat to the affected area up to 15 minutes at a time.  Ensure to place a barrier between your skin and the ice/heat.  You may follow-up with the [facility] health department regarding today's ED visit.  Return to the emergency department for experiencing increasing/worsening symptoms.

## 2022-02-20 NOTE — ED Triage Notes (Signed)
Pt presents to ED with complaints of lower back pain, constipation x 1-2 weeks. Pt states the symptoms started when he started tapering off his lisinopril.

## 2022-02-20 NOTE — ED Provider Notes (Signed)
[facility] EMERGENCY DEPARTMENT Provider Note   CSN: [id number] Arrival date & time: 02/20/22  1530    History  Chief Complaint  Patient presents with   Back Pain    Jeremy Hughes is a 26 y.o. male who presents emergency department with concerns for right lower back pain onset 1-2 weeks.  Denies recent injury, trauma, fall, twisting, heavy lifting.  No meds tried prior to arrival.  Patient notes that he tapered off his lisinopril recently and that is when his symptoms started.  He discontinued his lisinopril at the recommendation of his primary care provider who he saw within the past month with unremarkable labs.  Patient notes that today he is here for blood work just to ensure that his kidneys are okay after tapering the lisinopril.  Patient denies abdominal pain, nausea, vomiting, urinary symptoms, chest pain, shortness of breath.  The history is provided by the patient. No language interpreter was used.       Home Medications Prior to Admission medications   Medication Sig Start Date End Date Taking? Authorizing Provider  acetaminophen (TYLENOL) 500 MG tablet Take 1 tablet (500 mg total) by mouth every 6 (six) hours as needed. 10/15/19  Yes Avegno, Zachery Dakins, FNP  lisinopril (ZESTRIL) 10 MG tablet Take 5 mg by mouth daily. 01/15/22  Yes [provider]  pantoprazole (PROTONIX) 20 MG tablet Take 1 tablet (20 mg total) by mouth daily. Patient not taking: Reported on 02/20/2022 12/03/21   Redwine, Madison A, PA-C  pantoprazole (PROTONIX) 20 MG tablet Take 1 tablet (20 mg total) by mouth daily. Patient not taking: Reported on 02/20/2022 12/03/21   Redwine, Madison A, PA-C      Allergies    Patient has no known allergies.    Review of Systems   Review of Systems  Musculoskeletal:  Positive for back pain.  All other systems reviewed and are negative.   Physical Exam Updated Vital Signs BP (!) 144/92 (BP Location: Right Arm)   Pulse 84   Temp 98.1 F (36.7 C)  (Oral)   Resp 18   Ht 5\' 8"  (1.727 m)   Wt 136.1 kg   SpO2 98%   BMI 45.61 kg/m  Physical Exam Vitals and nursing note reviewed.  Constitutional:      General: He is not in acute distress.    Appearance: Normal appearance.  Eyes:     General: No scleral icterus.    Extraocular Movements: Extraocular movements intact.  Cardiovascular:     Rate and Rhythm: Normal rate and regular rhythm.     Pulses: Normal pulses.     Heart sounds: Normal heart sounds.  Pulmonary:     Effort: Pulmonary effort is normal. No respiratory distress.     Breath sounds: Normal breath sounds.  Abdominal:     Palpations: Abdomen is soft. There is no mass.     Tenderness: There is no abdominal tenderness.  Musculoskeletal:        General: Normal range of motion.     Cervical back: Neck supple.     Comments: No tenderness to palpation to spinal region.  No tenderness to palpation noted to musculature of back.  Skin:    General: Skin is warm and dry.     Findings: No rash.  Neurological:     Mental Status: He is alert.     Sensory: Sensation is intact.     Motor: Motor function is intact.  Psychiatric:  Behavior: Behavior normal.     ED Results / Procedures / Treatments   Labs (all labs ordered are listed, but only abnormal results are displayed) Labs Reviewed  BASIC METABOLIC PANEL  URINALYSIS, ROUTINE W REFLEX MICROSCOPIC  CBC WITH DIFFERENTIAL/PLATELET    EKG None  Radiology DG Lumbar Spine Complete  Result Date: 02/20/2022 CLINICAL DATA:  Low back pain EXAM: LUMBAR SPINE - COMPLETE 4 VIEW COMPARISON:  None Available. FINDINGS: There is no evidence of lumbar spine fracture. Alignment is normal. Intervertebral disc spaces are maintained. IMPRESSION: Negative. Electronically Signed   By: Allegra Lai M.D.   On: 02/20/2022 18:36    Procedures Procedures    Medications Ordered in ED Medications  lidocaine (LIDODERM) 5 % 1 patch (1 patch Transdermal Patch Applied 02/20/22  1751)    ED Course/ Medical Decision Making/ A&P                           Medical Decision Making Amount and/or Complexity of Data Reviewed Labs: ordered. Radiology: ordered.  Risk Prescription drug management.   Patient with right lumbar back pain without radiation. No urinary symptoms suggestive of UTI.  Vital signs, patient afebrile. On exam, patient with no spinal tenderness to palpation.  No tenderness to palpation noted to musculature of back. No concerning findings on exams. Differential diagnosis includes but is not limited to fracture, herniation, muscle strain, pyelonephritis, nephrolithiasis, acute cystitis.   Labs:  I ordered, and personally interpreted labs.  The pertinent results include:   Urinalysis, BMP, CBC unremarkable  Imaging: I ordered imaging studies including lumbar x-ray I independently visualized and interpreted imaging which showed: No acute fracture or dislocation I agree with the radiologist interpretation  Medications:  I ordered medication including Lidoderm patch, warm compress Reevaluation of the patient after these medicines and interventions, I reevaluated the patient and found that they have improved I have reviewed the patients home medicines and have made adjustments as needed   Disposition: Presentation suspicious for lumbar strain.  Doubt fracture, dislocation, herniation at this time.  Doubt concerns for acute cystitis, pyelonephritis, nephrolithiasis. After consideration of the diagnostic results and the patients response to treatment, I feel that the patient would benefit from Discharge home.  Patient instructed to follow-up with [facility] health department regarding today's ED visit.  Supportive care measures and strict return precautions discussed with patient at bedside. Pt acknowledges and verbalizes understanding. Pt appears safe for discharge. Follow up as indicated in discharge paperwork.    This chart was dictated using voice  recognition software, Dragon. Despite the best efforts of this provider to proofread and correct errors, errors may still occur which can change documentation meaning.   Final Clinical Impression(s) / ED Diagnoses Final diagnoses:  Acute right-sided low back pain without sciatica    Rx / DC Orders ED Discharge Orders     None         Ronalee Scheunemann A, PA-C 02/20/22 1846    Vanetta Mulders, MD 02/22/22 0018

## 2022-05-07 DIAGNOSIS — M545 Low back pain, unspecified: Secondary | ICD-10-CM | POA: Diagnosis not present

## 2022-05-07 DIAGNOSIS — Z6841 Body Mass Index (BMI) 40.0 and over, adult: Secondary | ICD-10-CM | POA: Diagnosis not present

## 2022-05-07 DIAGNOSIS — M62830 Muscle spasm of back: Secondary | ICD-10-CM | POA: Diagnosis not present

## 2022-05-07 DIAGNOSIS — I10 Essential (primary) hypertension: Secondary | ICD-10-CM | POA: Diagnosis not present

## 2022-06-27 ENCOUNTER — Ambulatory Visit
Admission: EM | Admit: 2022-06-27 | Discharge: 2022-06-27 | Disposition: A | Discharge status: Home / Self Care | Payer: Medicaid Other | Attending: Family Medicine | Admitting: Family Medicine

## 2022-06-27 DIAGNOSIS — I10 Essential (primary) hypertension: Secondary | ICD-10-CM

## 2022-06-27 MED ORDER — LISINOPRIL 10 MG PO TABS: 5.0000 mg | ORAL_TABLET | Freq: Every day | ORAL | 0 refills | Status: DC

## 2022-06-27 NOTE — Discharge Instructions (Signed)
Follow-up with your primary care provider soon as possible for recheck.  Log your home blood pressure readings as often as you can and bring these to your primary care appointment so that they can see the trends of your blood pressure readings.  Follow-up sooner for side effects or concerning blood pressure readings.

## 2022-06-27 NOTE — ED Provider Notes (Signed)
[facility]    CSN: [id number] Arrival date & time: 06/27/22  1615      History   Chief Complaint Chief Complaint  Patient presents with   Medication Refill    HPI Jeremy Hughes is a 27 y.o. male.   Patient presenting today requesting a refill on his lisinopril for hypertension.  Ran out of his medication 1 day ago.  States has been taking a full 10 mg tablet daily with no side effects and blood pressures have been running in the 130s over 80s range.  Denies chest pain, shortness of breath, headaches, visual changes.  Tried to call his PCP for refill today but did not get any answer.    Past Medical History:  Diagnosis Date   Hypertension     Patient Active Problem List   Diagnosis Date Noted   Low back pain 12/17/2019    History reviewed. No pertinent surgical history.     Home Medications    Prior to Admission medications   Medication Sig Start Date End Date Taking? Authorizing Provider  acetaminophen (TYLENOL) 500 MG tablet Take 1 tablet (500 mg total) by mouth every 6 (six) hours as needed. 10/15/19   Avegno, Darrelyn Hillock, FNP  lisinopril (ZESTRIL) 10 MG tablet Take 0.5 tablets (5 mg total) by mouth daily. 06/27/22   Volney American, PA-C  pantoprazole (PROTONIX) 20 MG tablet Take 1 tablet (20 mg total) by mouth daily. Patient not taking: Reported on 02/20/2022 12/03/21   Redwine, Madison A, PA-C  pantoprazole (PROTONIX) 20 MG tablet Take 1 tablet (20 mg total) by mouth daily. Patient not taking: Reported on 02/20/2022 12/03/21   Redwine, Cecilio Asper, PA-C    Family History Family History  Problem Relation Age of Onset   Healthy Mother    Healthy Father    Diabetes Maternal Aunt    Hypertension Maternal Aunt    Hypertension Maternal Grandmother    Diabetes Maternal Grandmother    Hypertension Maternal Grandfather     Social History Social History   Tobacco Use   Smoking status: Never   Smokeless tobacco: Never  Substance Use Topics    Alcohol use: No   Drug use: No     Allergies   Patient has no known allergies.   Review of Systems Review of Systems PER HPI  Physical Exam Triage Vital Signs ED Triage Vitals  Enc Vitals Group     BP 06/27/22 1625 (!) 139/90     Pulse Rate 06/27/22 1625 73     Resp 06/27/22 1625 20     Temp 06/27/22 1625 98.1 F (36.7 C)     Temp Source 06/27/22 1625 Oral     SpO2 06/27/22 1625 95 %     Weight --      Height --      Head Circumference --      Peak Flow --      Pain Score 06/27/22 1627 0     Pain Loc --      Pain Edu? --      Excl. in [place]? --    No data found.  Updated Vital Signs BP (!) 139/90 (BP Location: Right Arm)   Pulse 73   Temp 98.1 F (36.7 C) (Oral)   Resp 20   SpO2 95%   Visual Acuity Right Eye Distance:   Left Eye Distance:   Bilateral Distance:    Right Eye Near:   Left Eye Near:    Bilateral  Near:     Physical Exam Vitals and nursing note reviewed.  Constitutional:      Appearance: Normal appearance.  HENT:     Head: Atraumatic.  Eyes:     Extraocular Movements: Extraocular movements intact.     Conjunctiva/sclera: Conjunctivae normal.     Pupils: Pupils are equal, round, and reactive to light.  Cardiovascular:     Rate and Rhythm: Normal rate and regular rhythm.  Pulmonary:     Effort: Pulmonary effort is normal.     Breath sounds: Normal breath sounds.  Musculoskeletal:        General: Normal range of motion.     Cervical back: Normal range of motion and neck supple.  Skin:    General: Skin is warm and dry.  Neurological:     General: No focal deficit present.     Mental Status: He is oriented to person, place, and time.     Motor: No weakness.     Gait: Gait normal.  Psychiatric:        Mood and Affect: Mood normal.        Thought Content: Thought content normal.        Judgment: Judgment normal.      UC Treatments / Results  Labs (all labs ordered are listed, but only abnormal results are displayed) Labs  Reviewed - No data to display  EKG   Radiology No results found.  Procedures Procedures (including critical care time)  Medications Ordered in UC Medications - No data to display  Initial Impression / Assessment and Plan / UC Course  I have reviewed the triage vital signs and the nursing notes.  Pertinent labs & imaging results that were available during my care of the patient were reviewed by me and considered in my medical decision making (see chart for details).     Will refill the lisinopril as written, it states that he is to be taking half tablet daily but he has been tolerating a full tablet well.  He should follow-up with his primary care soon as possible to clarify what dose he should truly be on.  Continue logging home readings, follow-up for any side effects or concerns in the meantime.  Final Clinical Impressions(s) / UC Diagnoses   Final diagnoses:  Essential hypertension     Discharge Instructions      Follow-up with your primary care provider soon as possible for recheck.  Log your home blood pressure readings as often as you can and bring these to your primary care appointment so that they can see the trends of your blood pressure readings.  Follow-up sooner for side effects or concerning blood pressure readings.    ED Prescriptions     Medication Sig Dispense Auth. Provider   lisinopril (ZESTRIL) 10 MG tablet Take 0.5 tablets (5 mg total) by mouth daily. 90 tablet Volney American, [place]      PDMP not reviewed this encounter.   Volney American, [place] 06/27/22 [phone]

## 2022-06-27 NOTE — ED Triage Notes (Signed)
Pt needs a refill on his lisinopril. Pt ran out of meds x 1 day. Pt states he called his PCP for a visit but no answer.

## 2022-07-08 IMAGING — MR MR LUMBAR SPINE W/O CM
5 series · 31 of 48 positions shown · non-contrast
Comparison: 05/29/2017.

CLINICAL DATA: Low back pain, prior weight lifting injury.

EXAM:
MRI LUMBAR SPINE WITHOUT CONTRAST
TECHNIQUE: Multiplanar, multisequence MR imaging of the lumbar spine was
performed. No intravenous contrast was administered.

[Series 5: T2 · sagittal · 4.0mm · 0.68mm/px · 6 of 15 slices shown (1 of 2)]
[im 1/15]
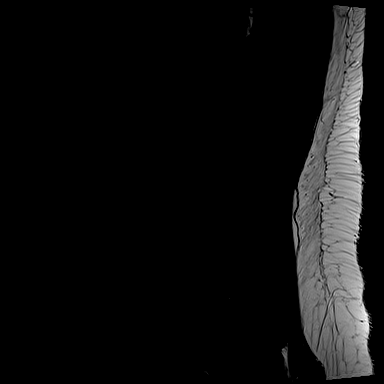
[im 3/15]
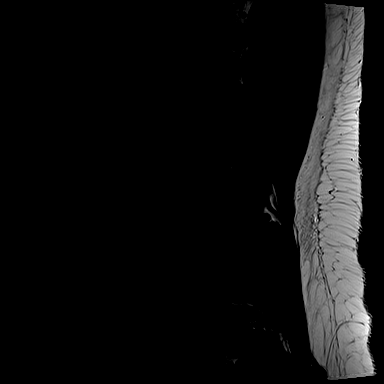
[im 6/15]
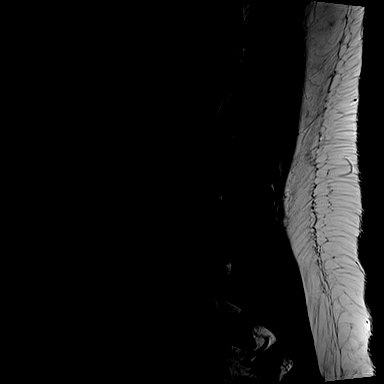
[im 9/15]
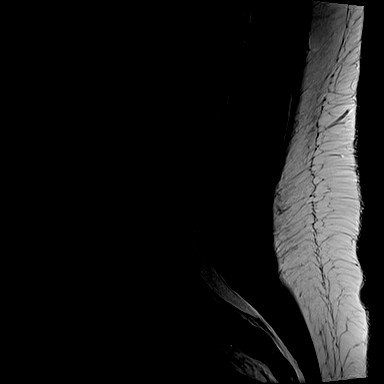
[im 12/15]
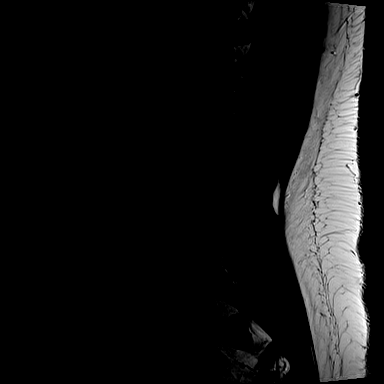
[im 15/15]
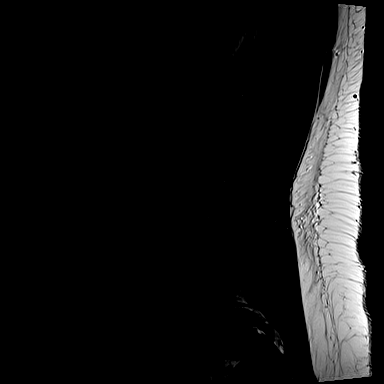

[Series 6: T1 · sagittal · 4.0mm · 0.81mm/px · 7 of 15 slices shown (1 of 2)]
[im 1/15]
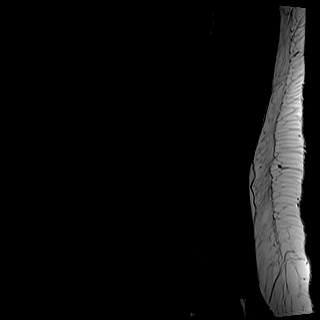
[im 3/15]
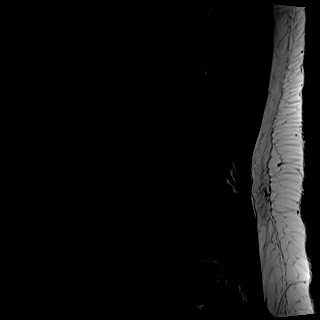
[im 5/15]
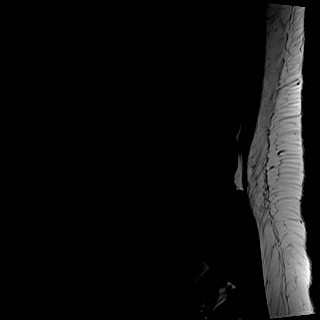
[im 8/15]
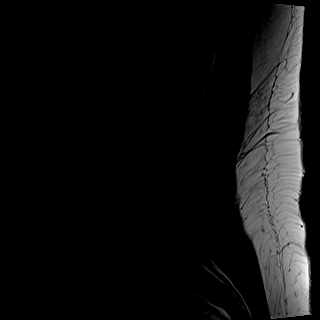
[im 10/15]
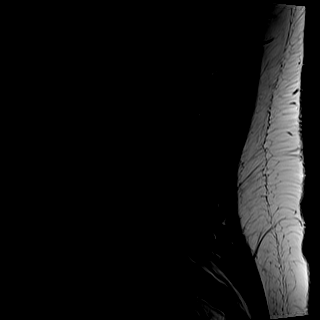
[im 12/15]
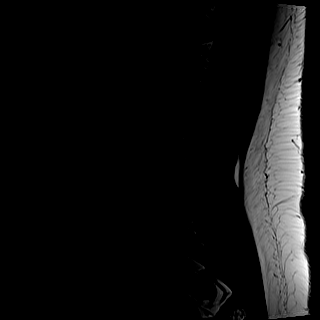
[im 15/15]
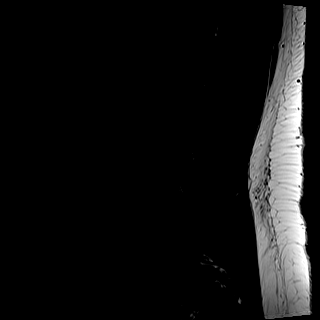

[Series 7: STIR · sagittal · 4.0mm · 0.51mm/px · 2 of 15 slices shown]
[im 1/15]
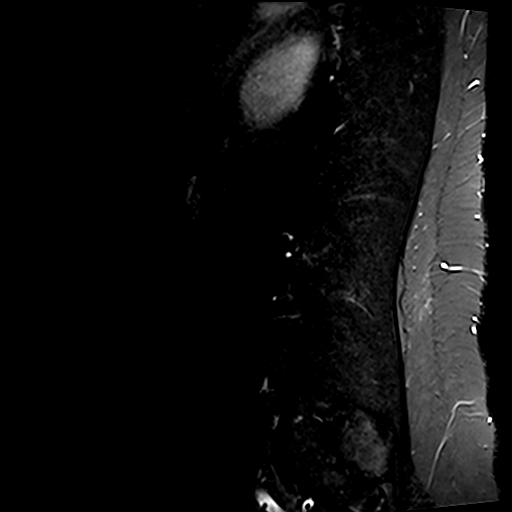
[im 3/15]
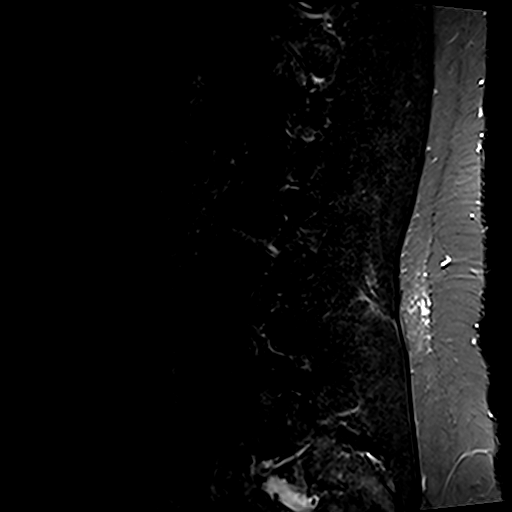

[Series 8: T2 · axial · 4.0mm · 0.70mm/px · z∈[-26,+166]mm · 8 of 31 slices shown (2 of 2)]
[im 1/31]
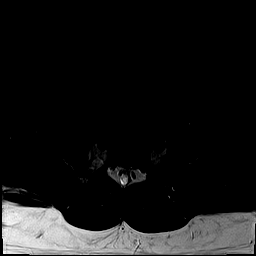
[im 5/31]
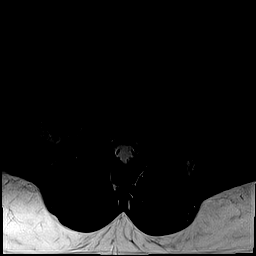
[im 10/31]
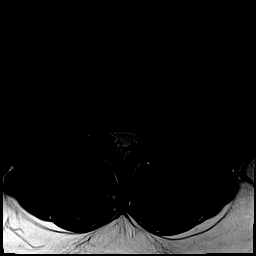
[im 14/31]
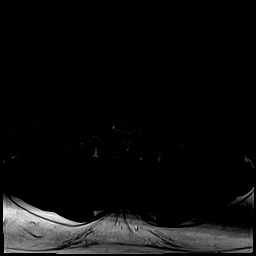
[im 17/31]
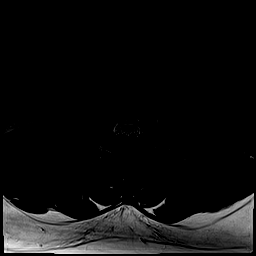
[im 21/31]
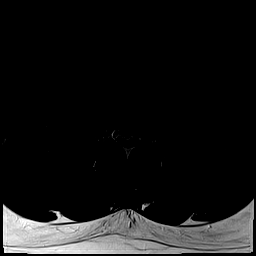
[im 26/31]
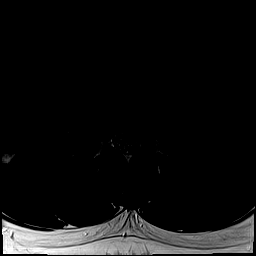
[im 31/31]
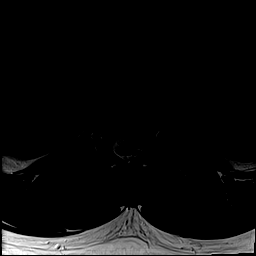

[Series 9: T1 · axial · 4.0mm · 0.35mm/px · z∈[-26,+166]mm · 8 of 31 slices shown (2 of 2)]
[im 1/31]
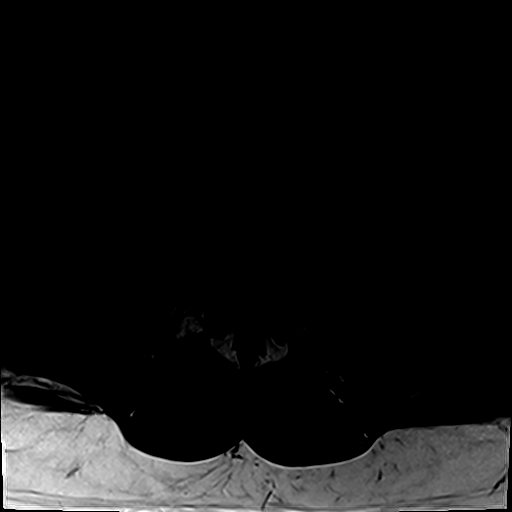
[im 5/31]
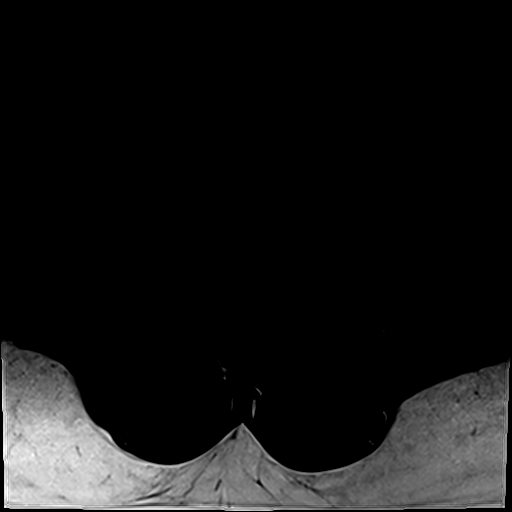
[im 10/31]
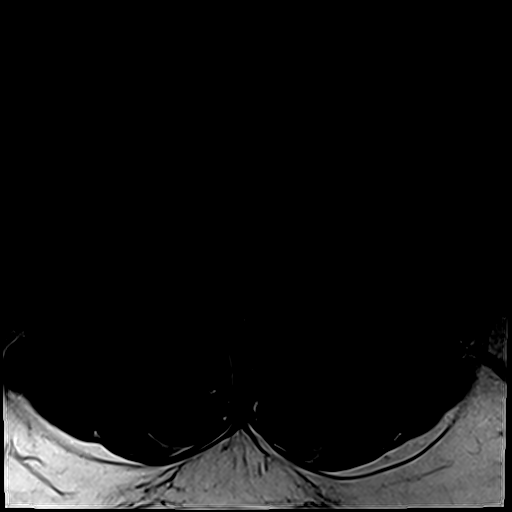
[im 14/31]
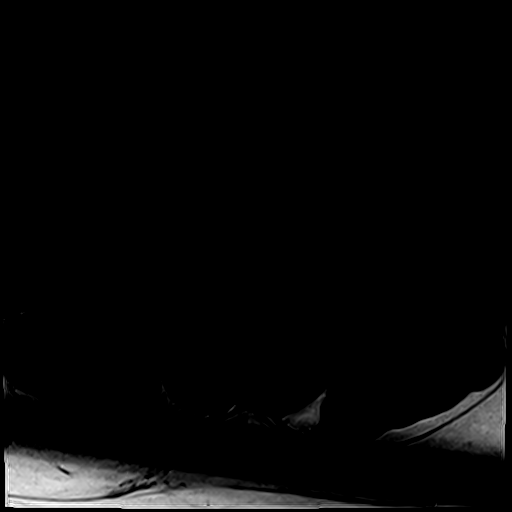
[im 17/31]
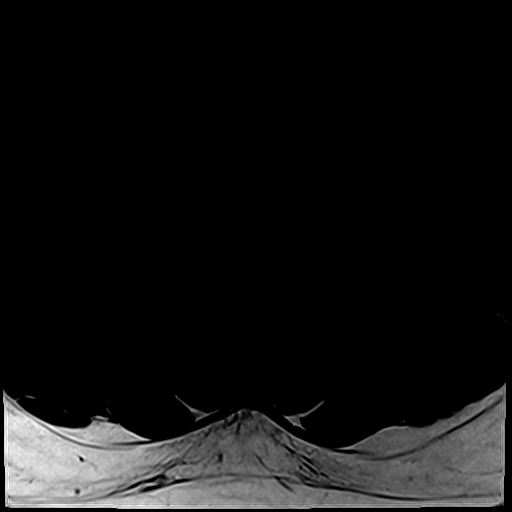
[im 21/31]
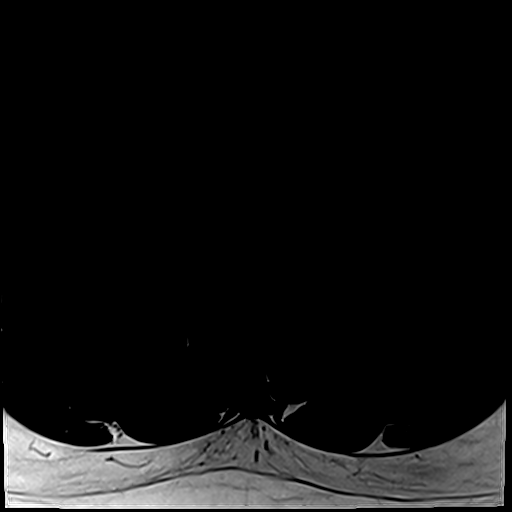
[im 26/31]
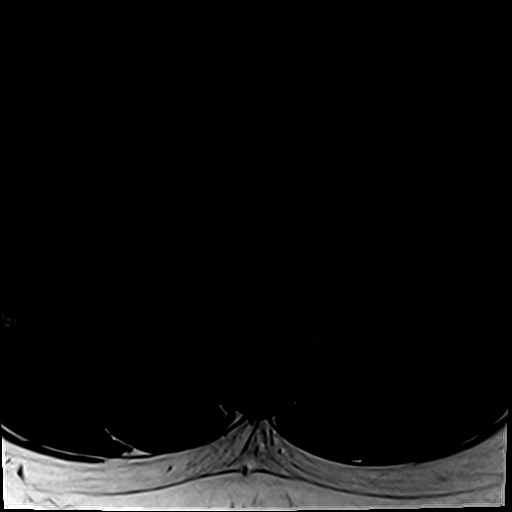
[im 31/31]
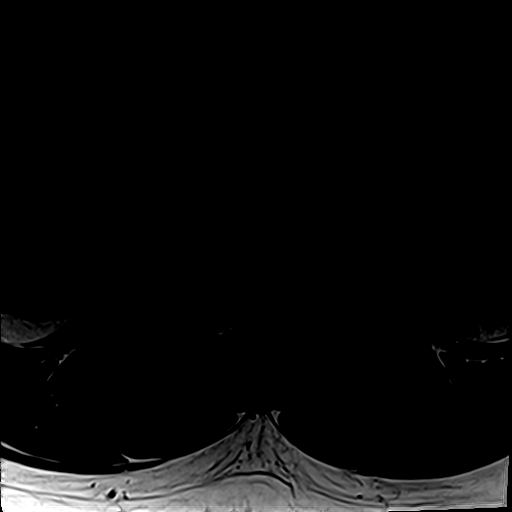

[31 of 48 positions shown; findings below may reference images not displayed]

FINDINGS: Segmentation:  Standard.

Alignment:  Normal.

Vertebrae: Diffusely T1/T2 hypointense appearance of the bone marrow
is nonspecific. This can be seen in obesity, smoking,
anemia/alternative hematologic pathologies and renal disease.

Vertebral body heights are preserved. T12-L1 Modic type 1 endplate
degenerative changes.

Conus medullaris and cauda equina: Conus extends to the L2 level.
Conus and cauda equina appear normal.

Disc levels: Mild desiccation at the T12-L1 and L5-S1 levels.

T12-L1: Shallow central protrusion with annular fissuring. Patent
spinal canal and neural foramen.

L1-2: No significant disc bulge. Patent spinal canal and neural
foramen.

L2-3: No significant disc bulge. Patent spinal canal and neural
foramen.

L3-4: No significant disc bulge. Patent spinal canal and neural
foramen.

L4-5: Minimal disc bulge with shallow central protrusion/annular
fissuring. Patent spinal canal and neural foramen.

L5-S1: Mild disc bulge with superimposed right paracentral
protrusion. Bilateral facet degenerative spurring. There is grazing
of the exiting right L5 nerve root. Patent spinal canal. Mild right
greater than left neural foraminal narrowing.

Paraspinal and other soft tissues: Dorsally oriented 5-6 mm
bilateral L5-S1 facet joint synovial cysts.
IMPRESSION: Mild degenerative changes most prominent at the L5-S1 level.

Right L5-S1 paracentral protrusion with grazing of the exiting right
L5 nerve root and mild bilateral neural foraminal narrowing.

Shallow central T12-L1 protrusion/annular fissuring. No significant
spinal canal narrowing.

## 2022-08-04 ENCOUNTER — Encounter (HOSPITAL_COMMUNITY): Payer: Self-pay

## 2022-08-04 ENCOUNTER — Emergency Department (HOSPITAL_COMMUNITY): Payer: Self-pay

## 2022-08-04 ENCOUNTER — Emergency Department (HOSPITAL_COMMUNITY)
Admission: EM | Admit: 2022-08-04 | Discharge: 2022-08-04 | Disposition: A | Discharge status: Home / Self Care | Payer: Self-pay | Attending: Emergency Medicine | Admitting: Emergency Medicine

## 2022-08-04 ENCOUNTER — Other Ambulatory Visit: Payer: Self-pay

## 2022-08-04 DIAGNOSIS — N50812 Left testicular pain: Secondary | ICD-10-CM

## 2022-08-04 MED ORDER — IBUPROFEN 600 MG PO TABS: 600.0000 mg | ORAL_TABLET | Freq: Four times a day (QID) | ORAL | 0 refills | Status: DC | PRN

## 2022-08-04 NOTE — ED Triage Notes (Signed)
C/o intermittent left testicular pain described as dull ache x1 week.  Pain is worse when waking in am or when laying down.  Denies urinary s/sy Denies unprotected sex.

## 2022-08-04 NOTE — ED Provider Notes (Signed)
[place] EMERGENCY DEPARTMENT AT [facility] Provider Note   CSN: [id number] Arrival date & time: 08/04/22  1338     History {Add pertinent medical, surgical, social history, OB history to HPI:1} Chief Complaint  Patient presents with   Testicle Pain    Jeremy Hughes is a 27 y.o. male.  HPI Patient reports he had intermittent testicular pain for about a week.  No known injury.  Patient reports he has not been sexually active for a number of months.  He denies any pain burning or discharge with urination.  No blood in the urine.  He reports the testicular pain is mostly in the mornings achy in quality.  Not exacerbated by activities.  No observable scrotal swelling.  No similar pain in the past.  Fever no chills otherwise feeling well.    Home Medications Prior to Admission medications   Medication Sig Start Date End Date Taking? Authorizing Provider  acetaminophen (TYLENOL) 500 MG tablet Take 1 tablet (500 mg total) by mouth every 6 (six) hours as needed. 10/15/19   Avegno, Zachery Dakins, FNP  lisinopril (ZESTRIL) 10 MG tablet Take 0.5 tablets (5 mg total) by mouth daily. 06/27/22   Particia Nearing, PA-C  pantoprazole (PROTONIX) 20 MG tablet Take 1 tablet (20 mg total) by mouth daily. Patient not taking: Reported on 02/20/2022 12/03/21   Redwine, Madison A, PA-C  pantoprazole (PROTONIX) 20 MG tablet Take 1 tablet (20 mg total) by mouth daily. Patient not taking: Reported on 02/20/2022 12/03/21   Redwine, Madison A, PA-C      Allergies    Patient has no known allergies.    Review of Systems   Review of Systems  Physical Exam Updated Vital Signs BP (!) 135/110 (BP Location: Left Arm)   Pulse 86   Temp 99 F (37.2 C) (Oral)   Resp 17   Wt 136 kg   SpO2 99%   BMI 45.59 kg/m  Physical Exam Constitutional:      Comments: Alert nontoxic well in appearance no acute distress  HENT:     Head: Normocephalic and atraumatic.     Mouth/Throat:     Pharynx:  Oropharynx is clear.  Pulmonary:     Effort: Pulmonary effort is normal.  Abdominal:     General: There is no distension.     Palpations: Abdomen is soft.     Tenderness: There is no abdominal tenderness. There is no guarding.  Genitourinary:    Comments: Normal appearance of penis and scrotum.  No swelling or erythema.  Left testicle smooth nontender.  No tenderness to spermatic cord.  Inguinal canal no mass or fullness. Skin:    General: Skin is warm and dry.  Neurological:     General: No focal deficit present.     Mental Status: He is oriented to person, place, and time.     ED Results / Procedures / Treatments   Labs (all labs ordered are listed, but only abnormal results are displayed) Labs Reviewed  URINALYSIS, ROUTINE W REFLEX MICROSCOPIC    EKG None  Radiology No results found.  Procedures Procedures  {Document cardiac monitor, telemetry assessment procedure when appropriate:1}  Medications Ordered in ED Medications - No data to display  ED Course/ Medical Decision Making/ A&P   {   Click here for ABCD2, HEART and other calculatorsREFRESH Note before signing :1}  Medical Decision Making  ***  {Document critical care time when appropriate:1} {Document review of labs and clinical decision tools ie heart score, Chads2Vasc2 etc:1}  {Document your independent review of radiology images, and any outside records:1} {Document your discussion with family members, caretakers, and with consultants:1} {Document social determinants of health affecting pt's care:1} {Document your decision making why or why not admission, treatments were needed:1} Final Clinical Impression(s) / ED Diagnoses Final diagnoses:  None    Rx / DC Orders ED Discharge Orders     None

## 2022-08-04 NOTE — Discharge Instructions (Addendum)
1.  At this time your ultrasound the testicles is normal.  You have a very small cyst on the left testicle.  This may cause discomfort if it swells periodically.  You should schedule a follow-up appointment with a urologist if you are having ongoing pain.  May follow-up with your family doctor first. 2.  Contact information for [facility] included in your discharge instructions. 3.  Your test do not show evidence of epididymitis.  However instructions are included in case you are getting episodes of mild inflammation that are resolving.  Epididymitis sometimes is an infection but oftentimes is from overuse or inflammation.  You may take ibuprofen as prescribed and elevate the testicles when at rest.

## 2022-08-04 NOTE — ED Notes (Signed)
PA aware of patient 

## 2022-11-29 ENCOUNTER — Ambulatory Visit
Admission: EM | Admit: 2022-11-29 | Discharge: 2022-11-29 | Disposition: A | Discharge status: Home / Self Care | Payer: Self-pay | Attending: Family Medicine | Admitting: Family Medicine

## 2022-11-29 DIAGNOSIS — I10 Essential (primary) hypertension: Secondary | ICD-10-CM

## 2022-11-29 MED ORDER — LISINOPRIL 10 MG PO TABS: 5.0000 mg | ORAL_TABLET | Freq: Every day | ORAL | 0 refills | Status: DC

## 2022-11-29 NOTE — ED Triage Notes (Signed)
Pt needing medication on lisinopril until being by his primary care. Last time patient too medication was yesterday.

## 2022-11-29 NOTE — ED Provider Notes (Signed)
[facility] CARE CENTER   [id number] 11/29/22 Arrival Time: 1022  ASSESSMENT & PLAN:  1. Elevated blood pressure reading in office with diagnosis of hypertension    Is trying to find a PCP.  Meds ordered this encounter  Medications   lisinopril (ZESTRIL) 10 MG tablet    Sig: Take 0.5 tablets (5 mg total) by mouth daily.    Dispense:  90 tablet    Refill:  0     Follow-up Information     [facility].   Specialty: Urgent Care Why: As needed. Contact information: [address], [address] [place] [address] [phone]                 Discharge Instructions      Your blood pressure was noted to be elevated during your visit today. If you are currently taking medication for high blood pressure, please ensure you are taking this as directed. If you do not have a history of high blood pressure and your blood pressure remains persistently elevated, you may need to begin taking a medication at some point. You may return here within the next few days to recheck if unable to see your primary care provider or if you do not have a one.  BP (!) 152/100 (BP Location: Right Arm)   Pulse 98   Temp 98.5 F (36.9 C) (Oral)   Resp 12   SpO2 96%   BP Readings from Last 3 Encounters:  11/29/22 (!) 152/100  08/04/22 128/78  06/27/22 (!) 139/90          Reviewed expectations re: course of current medical issues. Questions answered. Outlined signs and symptoms indicating need for more acute intervention. Patient verbalized understanding. After Visit Summary given.   SUBJECTIVE:  Jeremy Hughes is a 27 y.o. male who is needing refill of lisinopril until finding a primary care. Last time patient took medication was yesterday.   Social History   Tobacco Use  Smoking Status Never  Smokeless Tobacco Never     OBJECTIVE:  Vitals:   11/29/22 1256  BP: (!) 152/100  Pulse: 98  Resp: 12  Temp: 98.5 F (36.9 C)   TempSrc: Oral  SpO2: 96%    General appearance: alert; no distress  Psychological: alert and cooperative; normal mood and affect  Labs: Results for orders placed or performed during the hospital encounter of 02/20/22  CBC with Differential  Result Value Ref Range   WBC 7.0 4.0 - 10.5 K/uL   RBC 4.99 4.22 - 5.81 MIL/uL   Hemoglobin 15.1 13.0 - 17.0 g/dL   HCT 86.5 78.4 - 69.6 %   MCV 89.0 80.0 - 100.0 fL   MCH 30.3 26.0 - 34.0 pg   MCHC 34.0 30.0 - 36.0 g/dL   RDW 29.5 28.4 - 13.2 %   Platelets ACLMP 150 - 400 K/uL   nRBC 0.0 0.0 - 0.2 %   Neutrophils Relative % 59 %   Neutro Abs 4.1 1.7 - 7.7 K/uL   Lymphocytes Relative 32 %   Lymphs Abs 2.2 0.7 - 4.0 K/uL   Monocytes Relative 7 %   Monocytes Absolute 0.5 0.1 - 1.0 K/uL   Eosinophils Relative 1 %   Eosinophils Absolute 0.1 0.0 - 0.5 K/uL   Basophils Relative 1 %   Basophils Absolute 0.1 0.0 - 0.1 K/uL   WBC Morphology MORPHOLOGY UNREMARKABLE    RBC Morphology MORPHOLOGY UNREMARKABLE    Smear Review PLATELETS APPEAR ADEQUATE  Immature Granulocytes 0 %   Abs Immature Granulocytes 0.02 0.00 - 0.07 K/uL  Basic metabolic panel  Result Value Ref Range   Sodium 137 135 - 145 mmol/L   Potassium 4.2 3.5 - 5.1 mmol/L   Chloride 105 98 - 111 mmol/L   CO2 25 22 - 32 mmol/L   Glucose, Bld 88 70 - 99 mg/dL   BUN 11 6 - 20 mg/dL   Creatinine, Ser 1.61 0.61 - 1.24 mg/dL   Calcium 9.1 8.9 - 09.6 mg/dL   GFR, Estimated >04 >54 mL/min   Anion gap 7 5 - 15  Urinalysis, Routine w reflex microscopic Urine, Clean Catch  Result Value Ref Range   Color, Urine YELLOW YELLOW   APPearance CLEAR CLEAR   Specific Gravity, Urine 1.021 1.005 - 1.030   pH 5.0 5.0 - 8.0   Glucose, UA NEGATIVE NEGATIVE mg/dL   Hgb urine dipstick NEGATIVE NEGATIVE   Bilirubin Urine NEGATIVE NEGATIVE   Ketones, ur NEGATIVE NEGATIVE mg/dL   Protein, ur NEGATIVE NEGATIVE mg/dL   Nitrite NEGATIVE NEGATIVE   Leukocytes,Ua NEGATIVE NEGATIVE   Labs Reviewed -  No data to display  Imaging: No results found.  No Known Allergies  Past Medical History:  Diagnosis Date   Hypertension    Social History   Socioeconomic History   Marital status: Single    Spouse name: Not on file   Number of children: Not on file   Years of education: Not on file   Highest education level: Not on file  Occupational History   Not on file  Tobacco Use   Smoking status: Never   Smokeless tobacco: Never  Substance and Sexual Activity   Alcohol use: No   Drug use: No   Sexual activity: Not on file  Other Topics Concern   Not on file  Social History Narrative   Not on file   Social Determinants of Health   Financial Resource Strain: Not on file  Food Insecurity: Not on file  Transportation Needs: Not on file  Physical Activity: Not on file  Stress: Not on file  Social Connections: Not on file  Intimate Partner Violence: Not on file   Family History  Problem Relation Age of Onset   Healthy Mother    Healthy Father    Diabetes Maternal Aunt    Hypertension Maternal Aunt    Hypertension Maternal Grandmother    Diabetes Maternal Grandmother    Hypertension Maternal Grandfather    History reviewed. No pertinent surgical history.     Mardella Layman, MD 11/29/22 [phone]

## 2022-11-29 NOTE — Discharge Instructions (Signed)
Your blood pressure was noted to be elevated during your visit today. If you are currently taking medication for high blood pressure, please ensure you are taking this as directed. If you do not have a history of high blood pressure and your blood pressure remains persistently elevated, you may need to begin taking a medication at some point. You may return here within the next few days to recheck if unable to see your primary care provider or if you do not have a one.  BP (!) 152/100 (BP Location: Right Arm)   Pulse 98   Temp 98.5 F (36.9 C) (Oral)   Resp 12   SpO2 96%   BP Readings from Last 3 Encounters:  11/29/22 (!) 152/100  08/04/22 128/78  06/27/22 (!) 139/90

## 2022-12-10 ENCOUNTER — Encounter (HOSPITAL_COMMUNITY): Payer: Self-pay | Admitting: *Deleted

## 2022-12-10 ENCOUNTER — Emergency Department (HOSPITAL_COMMUNITY): Payer: Self-pay

## 2022-12-10 ENCOUNTER — Emergency Department (HOSPITAL_COMMUNITY)
Admission: EM | Admit: 2022-12-10 | Discharge: 2022-12-10 | Disposition: A | Discharge status: Home / Self Care | Payer: Self-pay | Attending: Emergency Medicine | Admitting: Emergency Medicine

## 2022-12-10 ENCOUNTER — Other Ambulatory Visit: Payer: Self-pay

## 2022-12-10 DIAGNOSIS — I10 Essential (primary) hypertension: Secondary | ICD-10-CM | POA: Insufficient documentation

## 2022-12-10 DIAGNOSIS — Z79899 Other long term (current) drug therapy: Secondary | ICD-10-CM | POA: Insufficient documentation

## 2022-12-10 DIAGNOSIS — N50812 Left testicular pain: Secondary | ICD-10-CM

## 2022-12-10 DIAGNOSIS — N433 Hydrocele, unspecified: Secondary | ICD-10-CM | POA: Insufficient documentation

## 2022-12-10 LAB — URINALYSIS, ROUTINE W REFLEX MICROSCOPIC
Bilirubin Urine: NEGATIVE
Glucose, UA: NEGATIVE mg/dL
Hgb urine dipstick: NEGATIVE
Ketones, ur: NEGATIVE mg/dL
Leukocytes,Ua: NEGATIVE
Nitrite: NEGATIVE
Protein, ur: NEGATIVE mg/dL
Specific Gravity, Urine: 1.01 (ref 1.005–1.030)
pH: 6 (ref 5.0–8.0)

## 2022-12-10 MED ORDER — IBUPROFEN 400 MG PO TABS
600.0000 mg | ORAL_TABLET | Freq: Once | ORAL | Status: AC
  Administered 2022-12-10: 600 mg via ORAL
  Filled 2022-12-10: qty 2

## 2022-12-10 NOTE — ED Triage Notes (Signed)
Pt with left testicle pain back in April and was seen and dx'd with cyst.  Pt states pain returned a month ago.  Denies any swelling.

## 2022-12-10 NOTE — ED Provider Notes (Signed)
[place] EMERGENCY DEPARTMENT AT [facility] Provider Note   CSN: [id number] Arrival date & time: 12/10/22  1130     History  Chief Complaint  Patient presents with   Jeremy Hughes    Arth I Mundinger is a 27 y.o. male.   Jeremy Hughes   27 year old male presents emergency department with complaints of left-sided Jeremy Hughes.  Patient reports intermittent Jeremy Hughes over the past month with acute worsening over the past couple of days.  Patient states that right now as he says, Hughes actually has improved.  Was seen in April for similar symptoms of left-sided Jeremy Hughes and was diagnosed with left epididymal cyst but did not follow-up with urology in the outpatient setting.  Denies any fever, abdominal Hughes, nausea, vomiting, urinary symptoms, penile discharge.    Past medical history significant for hypertension  Home Medications Prior to Admission medications   Medication Sig Start Date End Date Taking? Authorizing Provider  lisinopril (ZESTRIL) 10 MG tablet Take 0.5 tablets (5 mg total) by mouth daily. 11/29/22   Mardella Layman, MD      Allergies    Patient has no known allergies.    Review of Systems   Review of Systems  Genitourinary:  Positive for testicular Hughes.  All other systems reviewed and are negative.   Physical Exam Updated Vital Signs BP 125/80   Pulse 84   Temp 98.3 F (36.8 C) (Oral)   Resp 16   Ht 5\' 8"  (1.727 m)   Wt (!) 145.2 kg   SpO2 95%   BMI 48.66 kg/m  Physical Exam Vitals and nursing note reviewed.  Constitutional:      General: He is not in acute distress.    Appearance: He is well-developed.  HENT:     Head: Normocephalic and atraumatic.  Eyes:     Conjunctiva/sclera: Conjunctivae normal.  Cardiovascular:     Rate and Rhythm: Normal rate and regular rhythm.     Heart sounds: No murmur heard. Pulmonary:     Effort: Pulmonary effort is normal. No respiratory distress.     Breath sounds: Normal breath sounds.   Abdominal:     Palpations: Abdomen is soft.     Tenderness: There is no abdominal tenderness.  Genitourinary:    Penis: Normal and circumcised.      Testes: Cremasteric reflex is present.     Epididymis:     Right: Normal.     Comments: Tender palpation of left epididymis.  Cremasteric reflex intact bilaterally.  No evidence of erythema, palpable fluctuance/induration.  No inguinal hernia. Musculoskeletal:        General: No swelling.     Cervical back: Neck supple.  Skin:    General: Skin is warm and dry.     Capillary Refill: Capillary refill takes less than 2 seconds.  Neurological:     Mental Status: He is alert.  Psychiatric:        Mood and Affect: Mood normal.     ED Results / Procedures / Treatments   Labs (all labs ordered are listed, but only abnormal results are displayed) Labs Reviewed  URINALYSIS, ROUTINE W REFLEX MICROSCOPIC    EKG None  Radiology US SCROTUM W/DOPPLER  Result Date: 12/10/2022 CLINICAL DATA:  Left testicular Hughes for 1 month EXAM: SCROTAL ULTRASOUND DOPPLER ULTRASOUND OF THE TESTICLES TECHNIQUE: Complete ultrasound examination of the testicles, epididymis, and other scrotal structures was performed. Color and spectral Doppler ultrasound were also utilized to evaluate blood flow  to the testicles. COMPARISON:  08/04/2022 FINDINGS: Right Jeremy Measurements: 5.7 x 3.0 x 3.6 cm. No mass or microlithiasis visualized. Left Jeremy Measurements: 5.4 x 2.4 x 3.4 cm. No mass or microlithiasis visualized. Right epididymis:  Normal in size and appearance. Left epididymis:  7 mm epididymal cyst versus spermatocele. Hydrocele:  Small bilateral hydroceles Varicocele:  None visualized. Pulsed Doppler interrogation of both testes demonstrates normal low resistance arterial and venous waveforms bilaterally. IMPRESSION: No acute process or explanation for left-sided Hughes. Small bilateral hydroceles. Electronically Signed   By: Jeronimo Greaves M.D.   On: 12/10/2022  14:23    Procedures Procedures    Medications Ordered in ED Medications  ibuprofen (ADVIL) tablet 600 mg (600 mg Oral Given 12/10/22 1210)    ED Course/ Medical Decision Making/ A&P                                 Medical Decision Making Amount and/or Complexity of Data Reviewed Labs: ordered. Radiology: ordered.  Risk Prescription drug management.   This patient presents to the ED for concern of Jeremy Hughes, this involves an extensive number of treatment options, and is a complaint that carries with it a high risk of complications and morbidity.  The differential diagnosis includes testicular torsion, epididymitis, mass, varicocele, hydrocele, hernia, other   Co morbidities that complicate the patient evaluation  See HPI   Additional history obtained:  Additional history obtained from EMR External records from outside source obtained and reviewed including records   Lab Tests:  I Ordered, and personally interpreted labs.  The pertinent results include: UA without abnormality   Imaging Studies ordered:  I ordered imaging studies including ultrasound I independently visualized and interpreted imaging which showed negative.  Small bilateral hydrocele I agree with the radiologist interpretation   Cardiac Monitoring: / EKG:  The patient was maintained on a cardiac monitor.  I personally viewed and interpreted the cardiac monitored which showed an underlying rhythm of: Sinus rhythm   Consultations Obtained:  N/a   Problem List / ED Course / Critical interventions / Medication management  Left Jeremy Hughes I ordered medication including Motrin  Reevaluation of the patient after these medicines showed that the patient improved I have reviewed the patients home medicines and have made adjustments as needed   Social Determinants of Health:  Denies tobacco, illicit drug use   Test / Admission - Considered:  Left Jeremy Hughes Vitals signs within  normal range and stable throughout visit. Laboratory/imaging studies significant for: See above 27 year old male presents emergency department with complaints of intermittent left-sided Jeremy Hughes for the past month or so.  On exam, patient without any appreciable abnormality and without any appreciable tenderness.  Given reported testicular Hughes, ultrasound was obtained which was negative for any acute abnormality.  Doubt torsion.  Patient's prior left-sided epididymal cyst have resolved.  Patient with small bilateral hydrocele but seems to not be area of Hughes as he reported Hughes involving solely his left Jeremy.  Will recommend well-fitting underwear and NSAIDs as needed for Hughes in the future as well as follow-up with primary care/urology.  UA without signs of infection.  Treatment plan discussed at length with patient and he acknowledged understanding was agreeable to said plan.  Patient overall well-appearing, afebrile in no acute distress. Worrisome signs and symptoms were discussed with the patient, and the patient acknowledged understanding to return to the ED if noticed. Patient was  stable upon discharge.          Final Clinical Impression(s) / ED Diagnoses Final diagnoses:  Hughes in left Jeremy    Rx / DC Orders ED Discharge Orders     None         Peter Garter, [place] 12/10/22 1524    Bethann Berkshire, MD 12/12/22 [phone]

## 2022-12-10 NOTE — Discharge Instructions (Addendum)
As discussed, ultrasound was reassuring.  The cyst that was on your left epididymis has resolved.  He did have a small hydrocele on both sides that we do not think is causing your symptoms at this time.  No evidence of testicular torsion.  Recommend taking NSAIDs for your symptoms and follow-up with primary care for reassessment.  Please do not hesitate to return to emergency department if there are worrisome signs and symptoms we discussed become apparent.

## 2022-12-10 NOTE — ED Notes (Addendum)
Introduced self to pt Attached pt to partial monitor Pt stated that for a month his LEFT testicle has been painful but not swollen  Denies difficulty urinating and STD's Pain increases when sitting or laying.  Pt is pain free at this time UA sent to lab Pt to undress for MD eval

## 2023-03-03 ENCOUNTER — Encounter: Payer: Self-pay | Admitting: Emergency Medicine

## 2023-03-03 ENCOUNTER — Ambulatory Visit
Admission: EM | Admit: 2023-03-03 | Discharge: 2023-03-03 | Disposition: A | Discharge status: Home / Self Care | Payer: Self-pay | Attending: Nurse Practitioner | Admitting: Nurse Practitioner

## 2023-03-03 DIAGNOSIS — Z8679 Personal history of other diseases of the circulatory system: Secondary | ICD-10-CM

## 2023-03-03 DIAGNOSIS — Z760 Encounter for issue of repeat prescription: Secondary | ICD-10-CM

## 2023-03-03 DIAGNOSIS — J069 Acute upper respiratory infection, unspecified: Secondary | ICD-10-CM

## 2023-03-03 DIAGNOSIS — Z1152 Encounter for screening for COVID-19: Secondary | ICD-10-CM

## 2023-03-03 LAB — POCT RAPID STREP A (OFFICE): Rapid Strep A Screen: NEGATIVE

## 2023-03-03 MED ORDER — PROMETHAZINE-DM 6.25-15 MG/5ML PO SYRP: 5.0000 mL | ORAL_SOLUTION | Freq: Four times a day (QID) | ORAL | 0 refills | Status: AC | PRN

## 2023-03-03 MED ORDER — LISINOPRIL 10 MG PO TABS: 10.0000 mg | ORAL_TABLET | Freq: Every day | ORAL | 0 refills | Status: DC

## 2023-03-03 MED ORDER — FLUTICASONE PROPIONATE 50 MCG/ACT NA SUSP: 2.0000 | Freq: Every day | NASAL | 0 refills | Status: AC

## 2023-03-03 MED ORDER — CETIRIZINE HCL 10 MG PO TABS: 10.0000 mg | ORAL_TABLET | Freq: Every day | ORAL | 0 refills | Status: DC

## 2023-03-03 NOTE — ED Provider Notes (Signed)
[facility]    CSN: [id number] Arrival date & time: 03/03/23  0944      History   Chief Complaint No chief complaint on file.   HPI Jeremy Hughes is a 27 y.o. male.   The history is provided by the patient.   Patient presents for complaints of sore throat, cough, sneezing, and nasal congestion has been present for the past 3 days.  He denies fever, chills, headache, ear pain, wheezing, difficulty breathing, abdominal pain, nausea, vomiting, diarrhea, or rash.  Patient reports he has been taking over-the-counter cough and cold medications for his symptoms with minimal relief.  He denies any obvious known sick contacts.  Patient also requesting refill for his lisinopril.  States that he just ran out of his medication.  Denies chest pain, shortness of breath, difficulty breathing, or lower extremity edema.  Past Medical History:  Diagnosis Date   Hypertension     Patient Active Problem List   Diagnosis Date Noted   Low back pain 12/17/2019    History reviewed. No pertinent surgical history.     Home Medications    Prior to Admission medications   Medication Sig Start Date End Date Taking? Authorizing Provider  cetirizine (ZYRTEC) 10 MG tablet Take 1 tablet (10 mg total) by mouth daily. 03/03/23  Yes Leath-Warren, Sadie Haber, NP  fluticasone (FLONASE) 50 MCG/ACT nasal spray Place 2 sprays into both nostrils daily. 03/03/23  Yes Leath-Warren, Sadie Haber, NP  promethazine-dextromethorphan (PROMETHAZINE-DM) 6.25-15 MG/5ML syrup Take 5 mLs by mouth 4 (four) times daily as needed. 03/03/23  Yes Leath-Warren, Sadie Haber, NP  lisinopril (ZESTRIL) 10 MG tablet Take 1 tablet (10 mg total) by mouth daily. 03/03/23   Leath-Warren, Sadie Haber, NP    Family History Family History  Problem Relation Age of Onset   Healthy Mother    Healthy Father    Diabetes Maternal Aunt    Hypertension Maternal Aunt    Hypertension Maternal Grandmother    Diabetes Maternal  Grandmother    Hypertension Maternal Grandfather     Social History Social History   Tobacco Use   Smoking status: Never   Smokeless tobacco: Never  Substance Use Topics   Alcohol use: No   Drug use: No     Allergies   Patient has no known allergies.   Review of Systems Review of Systems Per HPI  Physical Exam Triage Vital Signs ED Triage Vitals  Encounter Vitals Group     BP 03/03/23 0951 (!) 141/95     Systolic BP Percentile --      Diastolic BP Percentile --      Pulse Rate 03/03/23 0951 (!) 104     Resp 03/03/23 0951 18     Temp 03/03/23 0951 98.4 F (36.9 C)     Temp Source 03/03/23 0951 Oral     SpO2 03/03/23 0951 95 %     Weight --      Height --      Head Circumference --      Peak Flow --      Pain Score 03/03/23 0952 0     Pain Loc --      Pain Education --      Exclude from Growth Chart --    No data found.  Updated Vital Signs BP (!) 141/95 (BP Location: Right Arm)   Pulse (!) 104   Temp 98.4 F (36.9 C) (Oral)   Resp 18   SpO2 95%  Visual Acuity Right Eye Distance:   Left Eye Distance:   Bilateral Distance:    Right Eye Near:   Left Eye Near:    Bilateral Near:     Physical Exam Vitals and nursing note reviewed.  Constitutional:      General: He is not in acute distress.    Appearance: Normal appearance.  HENT:     Head: Normocephalic.     Right Ear: Tympanic membrane, ear canal and external ear normal.     Left Ear: Tympanic membrane, ear canal and external ear normal.     Nose: Congestion present.     Right Turbinates: Enlarged and swollen.     Left Turbinates: Enlarged and swollen.     Right Sinus: No maxillary sinus tenderness or frontal sinus tenderness.     Left Sinus: No maxillary sinus tenderness or frontal sinus tenderness.     Mouth/Throat:     Lips: Pink.     Mouth: Mucous membranes are moist.     Pharynx: Uvula midline. Pharyngeal swelling and posterior oropharyngeal erythema present. No oropharyngeal exudate  or uvula swelling.     Tonsils: No tonsillar exudate. 1+ on the right. 1+ on the left.     Comments: Cobblestoning present to posterior oropharynx  Eyes:     Extraocular Movements: Extraocular movements intact.     Conjunctiva/sclera: Conjunctivae normal.     Pupils: Pupils are equal, round, and reactive to light.  Cardiovascular:     Rate and Rhythm: Regular rhythm. Tachycardia present.     Pulses: Normal pulses.     Heart sounds: Normal heart sounds.  Pulmonary:     Effort: Pulmonary effort is normal. No respiratory distress.     Breath sounds: Normal breath sounds. No stridor. No wheezing, rhonchi or rales.  Abdominal:     General: Bowel sounds are normal.     Palpations: Abdomen is soft.  Musculoskeletal:     Cervical back: Normal range of motion.  Lymphadenopathy:     Cervical: No cervical adenopathy.  Skin:    General: Skin is warm and dry.  Neurological:     General: No focal deficit present.     Mental Status: He is alert and oriented to person, place, and time.  Psychiatric:        Mood and Affect: Mood normal.        Behavior: Behavior normal.      UC Treatments / Results  Labs (all labs ordered are listed, but only abnormal results are displayed) Labs Reviewed  SARS CORONAVIRUS 2 (TAT 6-24 HRS)  CULTURE, GROUP A STREP [facility])  POCT RAPID STREP A (OFFICE)    EKG   Radiology No results found.  Procedures Procedures (including critical care time)  Medications Ordered in UC Medications - No data to display  Initial Impression / Assessment and Plan / UC Course  I have reviewed the triage vital signs and the nursing notes.  Pertinent labs & imaging results that were available during my care of the patient were reviewed by me and considered in my medical decision making (see chart for details).  Rapid strep test was negative.  COVID test and throat culture are pending.  We will otherwise manage for viral upper respiratory infection.  Physical exam  findings reassuring and vital signs stable for discharge.  Promethazine DM prescribed for cough, fluticasone 50 micro nasal spray for nasal congestion, and cetirizine 10 mg for nasal congestion and postnasal drainage.  Patient was provided a 30-day refill  for his lisinopril, encourage patient to establish care with a PCP as soon as possible.  Advised supportive care, offered symptomatic relief. Counseled patient on potential for adverse effects with medications prescribed/recommended today, ER and return-to-clinic precautions discussed, patient verbalized understanding.    Final Clinical Impressions(s) / UC Diagnoses   Final diagnoses:  Viral upper respiratory tract infection with cough  Medication refill  History of hypertension  Encounter for screening for COVID-19     Discharge Instructions      Take medication as prescribed. Increase fluids and allow for plenty of rest. Recommend Tylenol or ibuprofen as needed for pain, fever, or general discomfort. Warm salt water gargles 3-4 times daily to help with throat pain or discomfort. Recommend using a humidifier at bedtime during sleep to help with cough and nasal congestion. Sleep elevated on 2 pillows.  For your hypertension: Please try to find a primary care physician to establish care for continued management of your blood pressure. Recommend incorporating daily exercise and a low-sodium diet to control blood pressure. We have given you information today for a PCP office in the area who may be excepting patients. Follow-up as needed.     ED Prescriptions     Medication Sig Dispense Auth. Provider   lisinopril (ZESTRIL) 10 MG tablet Take 1 tablet (10 mg total) by mouth daily. 30 tablet Leath-Warren, Sadie Haber, NP   promethazine-dextromethorphan (PROMETHAZINE-DM) 6.25-15 MG/5ML syrup Take 5 mLs by mouth 4 (four) times daily as needed. 118 mL Leath-Warren, Sadie Haber, NP   fluticasone (FLONASE) 50 MCG/ACT nasal spray Place 2  sprays into both nostrils daily. 16 g Leath-Warren, Sadie Haber, NP   cetirizine (ZYRTEC) 10 MG tablet Take 1 tablet (10 mg total) by mouth daily. 30 tablet Leath-Warren, Sadie Haber, NP      PDMP not reviewed this encounter.   Abran Cantor, NP 03/03/23 1028

## 2023-03-03 NOTE — ED Triage Notes (Signed)
Sore throat and productive cough x 3 days.  States throat only hurts when coughing.  States also need a refill on his lisinopril 10mg .  States is trying to find a new doctor.

## 2023-03-03 NOTE — Discharge Instructions (Addendum)
Take medication as prescribed. Increase fluids and allow for plenty of rest. Recommend Tylenol or ibuprofen as needed for pain, fever, or general discomfort. Warm salt water gargles 3-4 times daily to help with throat pain or discomfort. Recommend using a humidifier at bedtime during sleep to help with cough and nasal congestion. Sleep elevated on 2 pillows.  For your hypertension: Please try to find a primary care physician to establish care for continued management of your blood pressure. Recommend incorporating daily exercise and a low-sodium diet to control blood pressure. We have given you information today for a PCP office in the area who may be excepting patients. Follow-up as needed.

## 2023-03-05 LAB — SARS CORONAVIRUS 2 (TAT 6-24 HRS): SARS Coronavirus 2: NEGATIVE

## 2023-03-06 LAB — CULTURE, GROUP A STREP (THRC)

## 2023-05-26 ENCOUNTER — Ambulatory Visit
Admission: EM | Admit: 2023-05-26 | Discharge: 2023-05-26 | Disposition: A | Discharge status: Home / Self Care | Payer: Self-pay | Attending: Nurse Practitioner | Admitting: Nurse Practitioner

## 2023-05-26 DIAGNOSIS — I10 Essential (primary) hypertension: Secondary | ICD-10-CM

## 2023-05-26 DIAGNOSIS — Z760 Encounter for issue of repeat prescription: Secondary | ICD-10-CM

## 2023-05-26 MED ORDER — LISINOPRIL 10 MG PO TABS: 10.0000 mg | ORAL_TABLET | Freq: Every day | ORAL | 0 refills | Status: DC

## 2023-05-26 NOTE — ED Triage Notes (Signed)
Pt reports  he needs his BP meds refilled.

## 2023-05-26 NOTE — ED Provider Notes (Signed)
[facility]    CSN: [id number] Arrival date & time: 05/26/23  0906      History   Chief Complaint No chief complaint on file.   HPI Jeremy Hughes is a 28 y.o. male.   The history is provided by the patient.   Patient presenting today requesting a refill on his lisinopril for hypertension.  States he ran out of his medication a few days ago.. States has been taking a full 10 mg tablet daily with no side effects and blood pressures stable. Denies chest pain, shortness of breath, headaches, visual changes.  States that he is trying to get on his mother's or his sisters insurance as he does not have any currently.    Past Medical History:  Diagnosis Date   Hypertension     Patient Active Problem List   Diagnosis Date Noted   Low back pain 12/17/2019    No past surgical history on file.     Home Medications    Prior to Admission medications   Medication Sig Start Date End Date Taking? Authorizing Provider  cetirizine (ZYRTEC) 10 MG tablet Take 1 tablet (10 mg total) by mouth daily. 03/03/23   Leath-Warren, Sadie Haber, NP  fluticasone (FLONASE) 50 MCG/ACT nasal spray Place 2 sprays into both nostrils daily. 03/03/23   Leath-Warren, Sadie Haber, NP  lisinopril (ZESTRIL) 10 MG tablet Take 1 tablet (10 mg total) by mouth daily. 03/03/23   Leath-Warren, Sadie Haber, NP  promethazine-dextromethorphan (PROMETHAZINE-DM) 6.25-15 MG/5ML syrup Take 5 mLs by mouth 4 (four) times daily as needed. 03/03/23   Leath-Warren, Sadie Haber, NP    Family History Family History  Problem Relation Age of Onset   Healthy Mother    Healthy Father    Diabetes Maternal Aunt    Hypertension Maternal Aunt    Hypertension Maternal Grandmother    Diabetes Maternal Grandmother    Hypertension Maternal Grandfather     Social History Social History   Tobacco Use   Smoking status: Never   Smokeless tobacco: Never  Substance Use Topics   Alcohol use: No   Drug use: No      Allergies   Patient has no known allergies.   Review of Systems Review of Systems Per HPI  Physical Exam Triage Vital Signs ED Triage Vitals  Encounter Vitals Group     BP 05/26/23 0929 (!) 140/91     Systolic BP Percentile --      Diastolic BP Percentile --      Pulse Rate 05/26/23 0929 98     Resp 05/26/23 0929 20     Temp 05/26/23 0929 98.4 F (36.9 C)     Temp Source 05/26/23 0929 Oral     SpO2 05/26/23 0929 97 %     Weight --      Height --      Head Circumference --      Peak Flow --      Pain Score 05/26/23 0931 0     Pain Loc --      Pain Education --      Exclude from Growth Chart --    No data found.  Updated Vital Signs BP (!) 140/91 (BP Location: Right Arm)   Pulse 98   Temp 98.4 F (36.9 C) (Oral)   Resp 20   SpO2 97%   Visual Acuity Right Eye Distance:   Left Eye Distance:   Bilateral Distance:    Right Eye Near:   Left  Eye Near:    Bilateral Near:     Physical Exam Vitals and nursing note reviewed.  Constitutional:      General: He is not in acute distress.    Appearance: Normal appearance.  HENT:     Head: Normocephalic.  Eyes:     Extraocular Movements: Extraocular movements intact.     Pupils: Pupils are equal, round, and reactive to light.  Cardiovascular:     Rate and Rhythm: Normal rate and regular rhythm.     Pulses: Normal pulses.     Heart sounds: Normal heart sounds.  Pulmonary:     Effort: Pulmonary effort is normal. No respiratory distress.     Breath sounds: Normal breath sounds. No stridor. No wheezing, rhonchi or rales.  Chest:     Chest wall: No tenderness.  Abdominal:     General: Bowel sounds are normal.     Palpations: Abdomen is soft.     Tenderness: There is no abdominal tenderness.  Musculoskeletal:     Cervical back: Normal range of motion.  Lymphadenopathy:     Cervical: No cervical adenopathy.  Skin:    General: Skin is warm and dry.  Neurological:     General: No focal deficit present.      Mental Status: He is alert and oriented to person, place, and time.  Psychiatric:        Mood and Affect: Mood normal.        Behavior: Behavior normal.      UC Treatments / Results  Labs (all labs ordered are listed, but only abnormal results are displayed) Labs Reviewed - No data to display  EKG   Radiology No results found.  Procedures Procedures (including critical care time)  Medications Ordered in UC Medications - No data to display  Initial Impression / Assessment and Plan / UC Course  I have reviewed the triage vital signs and the nursing notes.  Pertinent labs & imaging results that were available during my care of the patient were reviewed by me and considered in my medical decision making (see chart for details).  Patient presenting for medication refill.  On exam, lung sounds are clear throughout, room air sats at 97%.  30-day supply of lisinopril 10 mg provided.  Information provided regarding daily exercise and a low-sodium diet to control BP.  Discussion with patient regarding the importance of establishing care with a PCP.  Patient was given information for local PCP in the area who is excepting patients.  Patient was in agreement with this plan of care and verbalizes understanding.  All questions were answered.  Patient stable for discharge.  Final Clinical Impressions(s) / UC Diagnoses   Final diagnoses:  None   Discharge Instructions   None    ED Prescriptions   None    PDMP not reviewed this encounter.   Abran Cantor, NP 05/26/23 1000

## 2023-05-26 NOTE — Discharge Instructions (Addendum)
Please try to find a primary care physician to establish care for continued management of your blood pressure. Recommend incorporating daily exercise and a low-sodium diet to control blood pressure. We have given you information today for a PCP office in the area who may be excepting patients. Follow-up as needed.

## 2023-06-07 ENCOUNTER — Emergency Department (HOSPITAL_COMMUNITY)
Admission: EM | Admit: 2023-06-07 | Discharge: 2023-06-07 | Disposition: A | Discharge status: Home / Self Care | Payer: Self-pay | Attending: Emergency Medicine | Admitting: Emergency Medicine

## 2023-06-07 DIAGNOSIS — Z79899 Other long term (current) drug therapy: Secondary | ICD-10-CM | POA: Insufficient documentation

## 2023-06-07 DIAGNOSIS — I10 Essential (primary) hypertension: Secondary | ICD-10-CM

## 2023-06-07 DIAGNOSIS — L299 Pruritus, unspecified: Secondary | ICD-10-CM

## 2023-06-07 LAB — I-STAT CHEM 8, ED
BUN: 14 mg/dL (ref 6–20)
Calcium, Ion: 1.17 mmol/L (ref 1.15–1.40)
Chloride: 103 mmol/L (ref 98–111)
Creatinine, Ser: 1.3 mg/dL — ABNORMAL HIGH (ref 0.61–1.24)
Glucose, Bld: 91 mg/dL (ref 70–99)
HCT: 44 % (ref 39.0–52.0)
Hemoglobin: 15 g/dL (ref 13.0–17.0)
Potassium: 3.8 mmol/L (ref 3.5–5.1)
Sodium: 139 mmol/L (ref 135–145)
TCO2: 23 mmol/L (ref 22–32)

## 2023-06-07 MED ORDER — CETIRIZINE HCL 10 MG PO TABS: 10.0000 mg | ORAL_TABLET | Freq: Every day | ORAL | 0 refills | Status: AC

## 2023-06-07 MED ORDER — LISINOPRIL 10 MG PO TABS
10.0000 mg | ORAL_TABLET | Freq: Once | ORAL | Status: AC
  Administered 2023-06-07: 10 mg via ORAL
  Filled 2023-06-07: qty 1

## 2023-06-07 MED ORDER — EUCERIN ORIGINAL HEALING EX CREA: TOPICAL_CREAM | CUTANEOUS | 0 refills | Status: AC | PRN

## 2023-06-07 NOTE — ED Triage Notes (Signed)
 Pt c/o rash to bilateral lower legs for one month with intense itching.

## 2023-06-07 NOTE — ED Provider Notes (Signed)
 [place] [facility] Provider Note   CSN: [id number] Arrival date & time: 06/07/23  4098     History  Chief Complaint  Patient presents with   Rash    Jeremy Hughes is a 28 y.o. male.   Rash  Patient presents to the ED with complaints of itching in his lower legs for the last month.  Patient denies any rashes or itching elsewhere.  He has not noticed any specific retches in the lower leg.  He has not had any fevers or chills.  No chest pain or shortness of breath.  Patient does not currently have a primary care doctor.  He does have hypertension and just recently started his blood pressure medications again.  Patient is concerned he might have some type of medical conditions such as diabetes or kidney problems that is causing the itching and he is requesting testing    Home Medications Prior to Admission medications   Medication Sig Start Date End Date Taking? Authorizing Provider  Skin Protectants, Misc. (EUCERIN) cream Apply topically as needed for dry skin. 06/07/23  Yes Linwood Dibbles, MD  cetirizine (ZYRTEC) 10 MG tablet Take 1 tablet (10 mg total) by mouth daily. 06/07/23   Linwood Dibbles, MD  fluticasone (FLONASE) 50 MCG/ACT nasal spray Place 2 sprays into both nostrils daily. 03/03/23   Leath-Warren, Sadie Haber, NP  lisinopril (ZESTRIL) 10 MG tablet Take 1 tablet (10 mg total) by mouth daily. 05/26/23   Leath-Warren, Sadie Haber, NP  promethazine-dextromethorphan (PROMETHAZINE-DM) 6.25-15 MG/5ML syrup Take 5 mLs by mouth 4 (four) times daily as needed. 03/03/23   Leath-Warren, Sadie Haber, NP      Allergies    Patient has no known allergies.    Review of Systems   Review of Systems  Skin:  Positive for rash.    Physical Exam Updated Vital Signs BP (!) 163/97   Pulse 100   Temp 98.1 F (36.7 C)   Resp 18   SpO2 94%  Physical Exam Vitals and nursing note reviewed.  Constitutional:      General: He is not in acute distress.     Appearance: He is well-developed.  HENT:     Head: Normocephalic and atraumatic.     Right Ear: External ear normal.     Left Ear: External ear normal.  Eyes:     General: No scleral icterus.       Right eye: No discharge.        Left eye: No discharge.     Conjunctiva/sclera: Conjunctivae normal.  Neck:     Trachea: No tracheal deviation.  Cardiovascular:     Rate and Rhythm: Normal rate.  Pulmonary:     Effort: Pulmonary effort is normal. No respiratory distress.     Breath sounds: No stridor.  Abdominal:     General: There is no distension.  Musculoskeletal:        General: No swelling or deformity.     Cervical back: Neck supple.     Right lower leg: No edema.     Left lower leg: No edema.  Skin:    General: Skin is warm and dry.     Coloration: Skin is not jaundiced.     Findings: No rash.  Neurological:     Mental Status: He is alert. Mental status is at baseline.     Cranial Nerves: No dysarthria or facial asymmetry.     Motor: No seizure activity.  ED Results / Procedures / Treatments   Labs (all labs ordered are listed, but only abnormal results are displayed) Labs Reviewed  I-STAT CHEM 8, ED - Abnormal; Notable for the following components:      Result Value   Creatinine, Ser 1.30 (*)    All other components within normal limits    EKG None  Radiology No results found.  Procedures Procedures    Medications Ordered in ED Medications  lisinopril (ZESTRIL) tablet 10 mg (10 mg Oral Given 06/07/23 0915)    ED Course/ Medical Decision Making/ A&P Clinical Course as of 06/07/23 0934  Thu Jun 07, 2023  0922 I-stat chem 8, ED (not at [facility], [facility] or [facility])(!) Electrolyte panel shows stable creatinine.  BUN is not elevated.  Normal glucose [JK]    Clinical Course User Index [JK] Linwood Dibbles, MD                                 Medical Decision Making Amount and/or Complexity of Data Reviewed Labs:  Decision-making details documented in ED  Course.  Risk OTC drugs. Prescription drug management.   Patient presented to the ED for evaluation of itching in his lower legs.  No obvious rash noted.  Patient was concerned about possible metabolic etiologies.  He has normal renal function.  He does not appear jaundiced or icteric.  It is possible his symptoms could be related to dry skin.  Will have him try applying moisturizing cream and use over-the-counter antihistamines.  Patient also has a history of hypertension.  He has been taking his medications.  He does not currently have a PCP will provide referral to a primary care doctor        Final Clinical Impression(s) / ED Diagnoses Final diagnoses:  Pruritus  Itching  Hypertension, unspecified type    Rx / DC Orders ED Discharge Orders          Ordered    Skin Protectants, Misc. (EUCERIN) cream  As needed        06/07/23 0925    cetirizine (ZYRTEC) 10 MG tablet  Daily        06/07/23 0925    AMB Referral [facility] Care Management        06/07/23 0933              Linwood Dibbles, MD 06/07/23 [phone]

## 2023-06-07 NOTE — Discharge Instructions (Addendum)
 Continue your blood pressure medications.  Follow-up with a primary care doctor to be rechecked.  Take the medication to help with your itching.  Apply the moisturizing cream to see if that helps with your leg discomfort  Thank you for the opportunity to take care of you in our Emergency Department. You have been diagnosed with high blood pressure, also known as hypertension. This means that the force of blood against the walls of your blood vessels called is too strong. It also means that your heart has to work harder to move the blood. High blood pressure usually has no symptoms, but over time, it can cause serious health problems such as Heart attack and heart failure Stroke Kidney disease and failure Vision loss With the help from your healthcare provider and some important life style changes, you can manage your blood pressure and protect your health. Please read the instructions provided on hypertension, how to manage it and how to check your blood pressure. Additionally, use the blood pressure log provided to record your blood pressures. Take the blood pressure log with you to your primary care doctor so that they can adjust your blood pressure medications if needed. Please read the instructions on follow-up appointment. Return to the ER or Call 911 right away if you have any of these symptoms: Chest pain or shortness of breath Severe headache Weakness, tingling, or numbness of your face, arms, or legs (especially on 1 side of the body) Sudden change in vision Confusion, trouble speaking, or trouble understanding speech

## 2023-07-02 ENCOUNTER — Ambulatory Visit
Admission: EM | Admit: 2023-07-02 | Discharge: 2023-07-02 | Disposition: A | Discharge status: Home / Self Care | Payer: Self-pay | Attending: Nurse Practitioner | Admitting: Nurse Practitioner

## 2023-07-02 DIAGNOSIS — I10 Essential (primary) hypertension: Secondary | ICD-10-CM

## 2023-07-02 DIAGNOSIS — Z760 Encounter for issue of repeat prescription: Secondary | ICD-10-CM

## 2023-07-02 MED ORDER — LISINOPRIL 10 MG PO TABS: 10.0000 mg | ORAL_TABLET | Freq: Every day | ORAL | 0 refills | Status: AC

## 2023-07-02 NOTE — ED Triage Notes (Signed)
 Pt reports needing a rx refill on lisinopril. Pt states he has been out of medication x 1 day.

## 2023-07-02 NOTE — ED Provider Notes (Signed)
 [facility]    CSN: [id number] Arrival date & time: 07/02/23  0865      History   Chief Complaint No chief complaint on file.   HPI Jeremy Hughes is a 28 y.o. male.   The history is provided by the patient.   Patient presenting today requesting a refill on his lisinopril for hypertension.  Patient states he has 2-3 tablets remaining.  States has been taking a full 10 mg tablet daily with no side effects and blood pressures stable. Denies chest pain, shortness of breath, headaches, visual changes, LE edema.  Today he states he does not work, and currently does not have insurance, which is why he continues to return here for refills on his medication.    Past Medical History:  Diagnosis Date   Hypertension     Patient Active Problem List   Diagnosis Date Noted   Low back pain 12/17/2019    History reviewed. No pertinent surgical history.     Home Medications    Prior to Admission medications   Medication Sig Start Date End Date Taking? Authorizing Provider  cetirizine (ZYRTEC) 10 MG tablet Take 1 tablet (10 mg total) by mouth daily. 06/07/23   Linwood Dibbles, MD  fluticasone (FLONASE) 50 MCG/ACT nasal spray Place 2 sprays into both nostrils daily. 03/03/23   Leath-Warren, Sadie Haber, NP  lisinopril (ZESTRIL) 10 MG tablet Take 1 tablet (10 mg total) by mouth daily. 05/26/23   Leath-Warren, Sadie Haber, NP  promethazine-dextromethorphan (PROMETHAZINE-DM) 6.25-15 MG/5ML syrup Take 5 mLs by mouth 4 (four) times daily as needed. 03/03/23   Leath-Warren, Sadie Haber, NP  Skin Protectants, Misc. (EUCERIN) cream Apply topically as needed for dry skin. 06/07/23   Linwood Dibbles, MD    Family History Family History  Problem Relation Age of Onset   Healthy Mother    Healthy Father    Diabetes Maternal Aunt    Hypertension Maternal Aunt    Hypertension Maternal Grandmother    Diabetes Maternal Grandmother    Hypertension Maternal Grandfather     Social History Social  History   Tobacco Use   Smoking status: Never   Smokeless tobacco: Never  Substance Use Topics   Alcohol use: No   Drug use: No     Allergies   Patient has no known allergies.   Review of Systems Review of Systems Per HPI  Physical Exam Triage Vital Signs ED Triage Vitals  Encounter Vitals Group     BP 07/02/23 0843 (!) 142/98     Systolic BP Percentile --      Diastolic BP Percentile --      Pulse Rate 07/02/23 0843 94     Resp 07/02/23 0843 18     Temp 07/02/23 0843 98.6 F (37 C)     Temp Source 07/02/23 0843 Oral     SpO2 07/02/23 0843 95 %     Weight --      Height --      Head Circumference --      Peak Flow --      Pain Score 07/02/23 0846 0     Pain Loc --      Pain Education --      Exclude from Growth Chart --    No data found.  Updated Vital Signs BP (!) 142/98 (BP Location: Right Arm)   Pulse 94   Temp 98.6 F (37 C) (Oral)   Resp 18   SpO2 95%   Visual  Acuity Right Eye Distance:   Left Eye Distance:   Bilateral Distance:    Right Eye Near:   Left Eye Near:    Bilateral Near:     Physical Exam Vitals and nursing note reviewed.  Constitutional:      General: He is not in acute distress.    Appearance: Normal appearance.  HENT:     Head: Normocephalic.     Mouth/Throat:     Mouth: Mucous membranes are moist.  Eyes:     Extraocular Movements: Extraocular movements intact.     Pupils: Pupils are equal, round, and reactive to light.  Cardiovascular:     Rate and Rhythm: Normal rate and regular rhythm.     Pulses: Normal pulses.     Heart sounds: Normal heart sounds.  Pulmonary:     Effort: Pulmonary effort is normal. No respiratory distress.     Breath sounds: Normal breath sounds. No stridor. No wheezing, rhonchi or rales.  Abdominal:     General: Bowel sounds are normal.     Palpations: Abdomen is soft.     Tenderness: There is no abdominal tenderness.  Musculoskeletal:     Cervical back: Normal range of motion.  Skin:     General: Skin is warm and dry.  Neurological:     General: No focal deficit present.     Mental Status: He is alert and oriented to person, place, and time.  Psychiatric:        Mood and Affect: Mood normal.        Behavior: Behavior normal.      UC Treatments / Results  Labs (all labs ordered are listed, but only abnormal results are displayed) Labs Reviewed - No data to display  EKG   Radiology No results found.  Procedures Procedures (including critical care time)  Medications Ordered in UC Medications - No data to display  Initial Impression / Assessment and Plan / UC Course  I have reviewed the triage vital signs and the nursing notes.  Pertinent labs & imaging results that were available during my care of the patient were reviewed by me and considered in my medical decision making (see chart for details).  Patient presenting for medication refill. On exam, lung sounds are clear throughout, room air sats at 97%. 30-day supply of lisinopril 10 mg provided. Information provided regarding daily exercise and a low-sodium diet to control BP. Discussion with patient regarding the importance of establishing care with a PCP and how it is not appropriate to continue to get refills through this clinic.Marland Kitchen Patient was given information for local PCP in the area who is excepting patients as well as an application for financial assistance through [facility] health to help establish care with PCP.Marland Kitchen Patient was in agreement with this plan of care and verbalizes understanding. All questions were answered. Patient stable for discharge.   Final Clinical Impressions(s) / UC Diagnoses   Final diagnoses:  None   Discharge Instructions   None    ED Prescriptions   None    PDMP not reviewed this encounter.   Abran Cantor, NP 07/02/23 [phone]

## 2023-07-02 NOTE — Discharge Instructions (Addendum)
 Please try to find a primary care physician to establish care for continued management of your blood pressure. Recommend incorporating daily exercise and a low-sodium diet to control blood pressure. We have given you information today for a PCP office in the area who may be excepting patients.  I have also provided an application for financial assistance to help establish care. Follow-up as needed. Follow-up as needed.

## 2023-11-05 ENCOUNTER — Emergency Department (HOSPITAL_COMMUNITY)
Admission: EM | Admit: 2023-11-05 | Discharge: 2023-11-05 | Disposition: A | Discharge status: Home / Self Care | Payer: Self-pay

## 2023-11-05 ENCOUNTER — Emergency Department (HOSPITAL_COMMUNITY): Payer: Self-pay

## 2023-11-05 DIAGNOSIS — Y9367 Activity, basketball: Secondary | ICD-10-CM | POA: Insufficient documentation

## 2023-11-05 DIAGNOSIS — X500XXA Overexertion from strenuous movement or load, initial encounter: Secondary | ICD-10-CM | POA: Insufficient documentation

## 2023-11-05 DIAGNOSIS — S93401A Sprain of unspecified ligament of right ankle, initial encounter: Secondary | ICD-10-CM | POA: Insufficient documentation

## 2023-11-05 NOTE — ED Provider Notes (Signed)
 [place] EMERGENCY DEPARTMENT AT [facility] Provider Note   CSN: [id number] Arrival date & time: 11/05/23  9147     Patient presents with: Ankle Pain   Johntay I Lizak is a 28 y.o. male.    Ankle Pain Associated symptoms: no back pain and no fever    Patient presents because of right ankle pain.  Patient states he sprained his ankle about a month ago.  Play basketball.  Since then has been having pain in both medial and lateral malleolus is mostly medial.  Hurts with weightbearing exercises.  Has been trying some pool exercises to help strengthen his ankle.  No knee pain.  Denies other complaints.    Prior to Admission medications   Medication Sig Start Date End Date Taking? Authorizing Provider  cetirizine  (ZYRTEC ) 10 MG tablet Take 1 tablet (10 mg total) by mouth daily. 06/07/23   Randol Simmonds, MD  fluticasone  (FLONASE ) 50 MCG/ACT nasal spray Place 2 sprays into both nostrils daily. 03/03/23   Leath-Warren, Etta PARAS, NP  lisinopril  (ZESTRIL ) 10 MG tablet Take 1 tablet (10 mg total) by mouth daily. 07/02/23   Leath-Warren, Etta PARAS, NP  promethazine -dextromethorphan (PROMETHAZINE -DM) 6.25-15 MG/5ML syrup Take 5 mLs by mouth 4 (four) times daily as needed. 03/03/23   Leath-Warren, Etta PARAS, NP  Skin Protectants, Misc. (EUCERIN) cream Apply topically as needed for dry skin. 06/07/23   Randol Simmonds, MD    Allergies: Patient has no known allergies.    Review of Systems  Constitutional:  Negative for chills and fever.  HENT:  Negative for ear pain and sore throat.   Eyes:  Negative for pain and visual disturbance.  Respiratory:  Negative for cough and shortness of breath.   Cardiovascular:  Negative for chest pain and palpitations.  Gastrointestinal:  Negative for abdominal pain and vomiting.  Genitourinary:  Negative for dysuria and hematuria.  Musculoskeletal:  Negative for arthralgias and back pain.  Skin:  Negative for color change and rash.  Neurological:   Negative for seizures and syncope.  All other systems reviewed and are negative.   Updated Vital Signs BP (!) 148/101   Pulse (!) 108   Temp 98.1 F (36.7 C)   Resp 16   Ht 5' 8 (1.727 m)   Wt (!) 138.3 kg   SpO2 100%   BMI 46.38 kg/m   Physical Exam Vitals and nursing note reviewed.  Constitutional:      General: He is not in acute distress.    Appearance: He is well-developed.  HENT:     Head: Normocephalic and atraumatic.  Eyes:     Conjunctiva/sclera: Conjunctivae normal.  Cardiovascular:     Rate and Rhythm: Normal rate and regular rhythm.     Heart sounds: No murmur heard. Pulmonary:     Effort: Pulmonary effort is normal. No respiratory distress.     Breath sounds: Normal breath sounds.  Abdominal:     Palpations: Abdomen is soft.     Tenderness: There is no abdominal tenderness.  Musculoskeletal:        General: No swelling.     Cervical back: Neck supple.       Feet:  Skin:    General: Skin is warm and dry.     Capillary Refill: Capillary refill takes less than 2 seconds.  Neurological:     Mental Status: He is alert.  Psychiatric:        Mood and Affect: Mood normal.     (all labs  ordered are listed, but only abnormal results are displayed) Labs Reviewed - No data to display  EKG: None  Radiology: DG Ankle Complete Right Result Date: 11/05/2023 CLINICAL DATA:  Right ankle pain following a twisting injury. EXAM: RIGHT ANKLE - COMPLETE 3+ VIEW COMPARISON:  None Available. FINDINGS: There is no evidence of fracture, dislocation, or joint effusion. There is no evidence of arthropathy or other focal bone abnormality. Soft tissues are unremarkable. IMPRESSION: Negative. Electronically Signed   By: Elspeth Bathe M.D.   On: 11/05/2023 10:22     Procedures   Medications Ordered in the ED - No data to display                                  Medical Decision Making Amount and/or Complexity of Data Reviewed Radiology: ordered.   Patient presents  because of right ankle pain.  Patient states he sprained his ankle about a month ago.  Play basketball.  Since then has been having pain in both medial and lateral malleolus is mostly medial.  Hurts with weightbearing exercises.  Has been trying some pool exercises to help strengthen his ankle.  No knee pain.  Denies other complaints   On exam, no  swelling of the ankle joint.  Full range of motion of the ankle both plantar and dorsiflexion as well as inversion and eversion.  Thompson's test showed Achilles intact.  No concerns for any kind of injury to the Achilles.  Patient has some slight pain to palpation both medial and lateral malleolus.   Did attain x-ray.  Unremarkable.   Recommended NSAIDs.  Recommended strength exercises with plantar dorsiflexion as well as inversion eversion exercises.  Patient will follow-up with sports medicine if pain continues.           Final diagnoses:  Sprain of right ankle, unspecified ligament, initial encounter    ED Discharge Orders     None          Simon Lavonia SAILOR, MD 11/05/23 1029

## 2023-11-05 NOTE — ED Triage Notes (Signed)
 Patient arrived POV from home with c/o right ankle pain. Patient states he rolled his ankle about 1 month ago playing basketball. Patient reports pain 8/10 when ambulating but no pain when sitting. Patient states he has been using ice packs and Ibuprofen  for pain.

## 2023-11-05 NOTE — Discharge Instructions (Addendum)
 Please follow up with sports medicine as we discussed   As discussed, continue with your range of motion of exercises.  Continue with ABCs.  You can start doing leg raises as well on the edge of a step.  You can also use the balancing platforms IgM's that we discussed to help strengthen your ligaments and tendons.  You can also use resistance band to strengthen your inversion and eversion of your foot.  You can look up exercises online for the inversion and eversion of your foot to help strengthen the ankle.  NSAIDs such as ibuprofen  is good for anti-inflammatory.  Your x ray does not show any acute fracture.

## 2023-12-31 IMAGING — CR DG CHEST 2V
2 series · 2 of 2 positions shown · non-contrast
Comparison: None available

CLINICAL DATA: Chest pain and tightness in a 25-year-old male with
shortness of breath that began last night.

EXAM:
CHEST - 2 VIEW

[w chest pa]
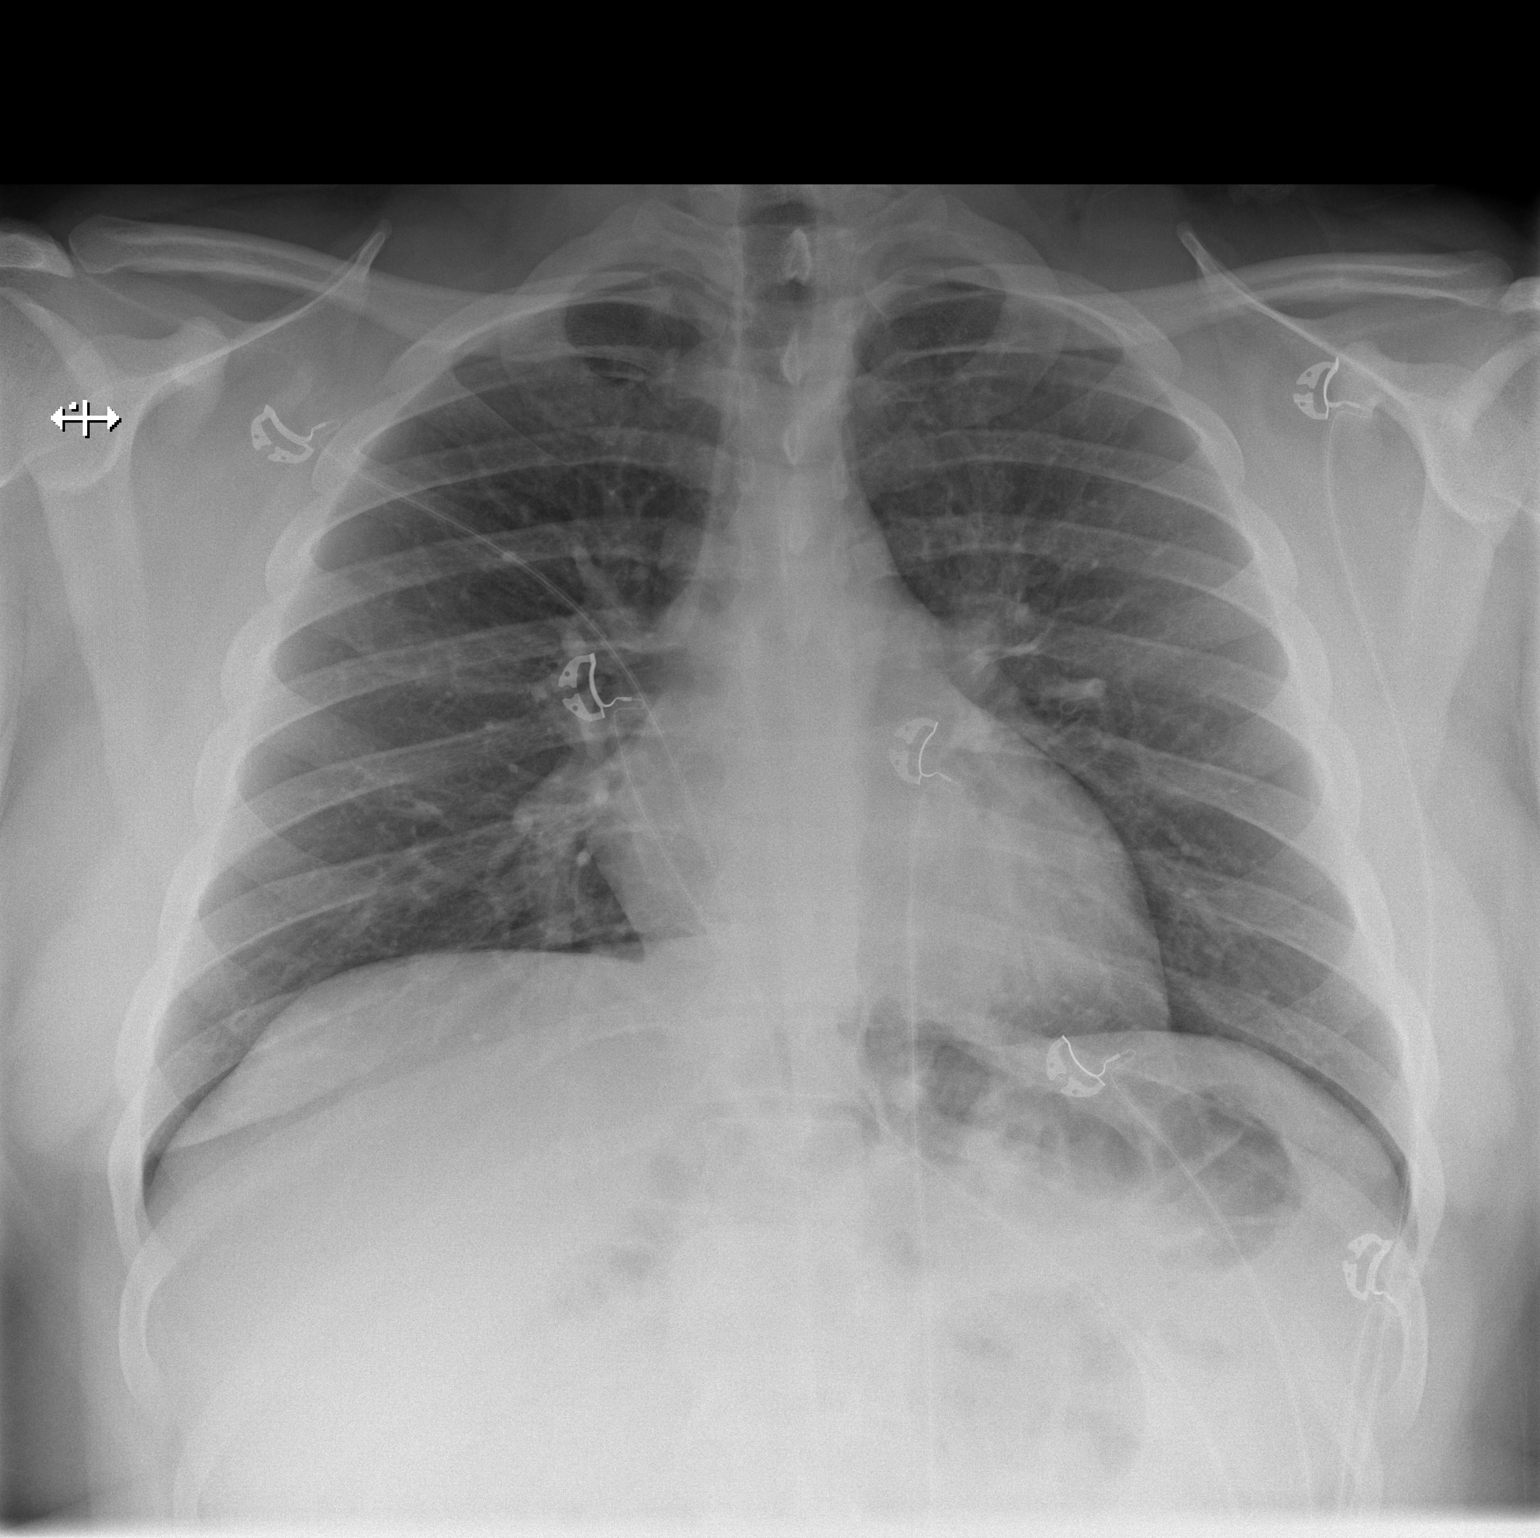

[w chest lat]
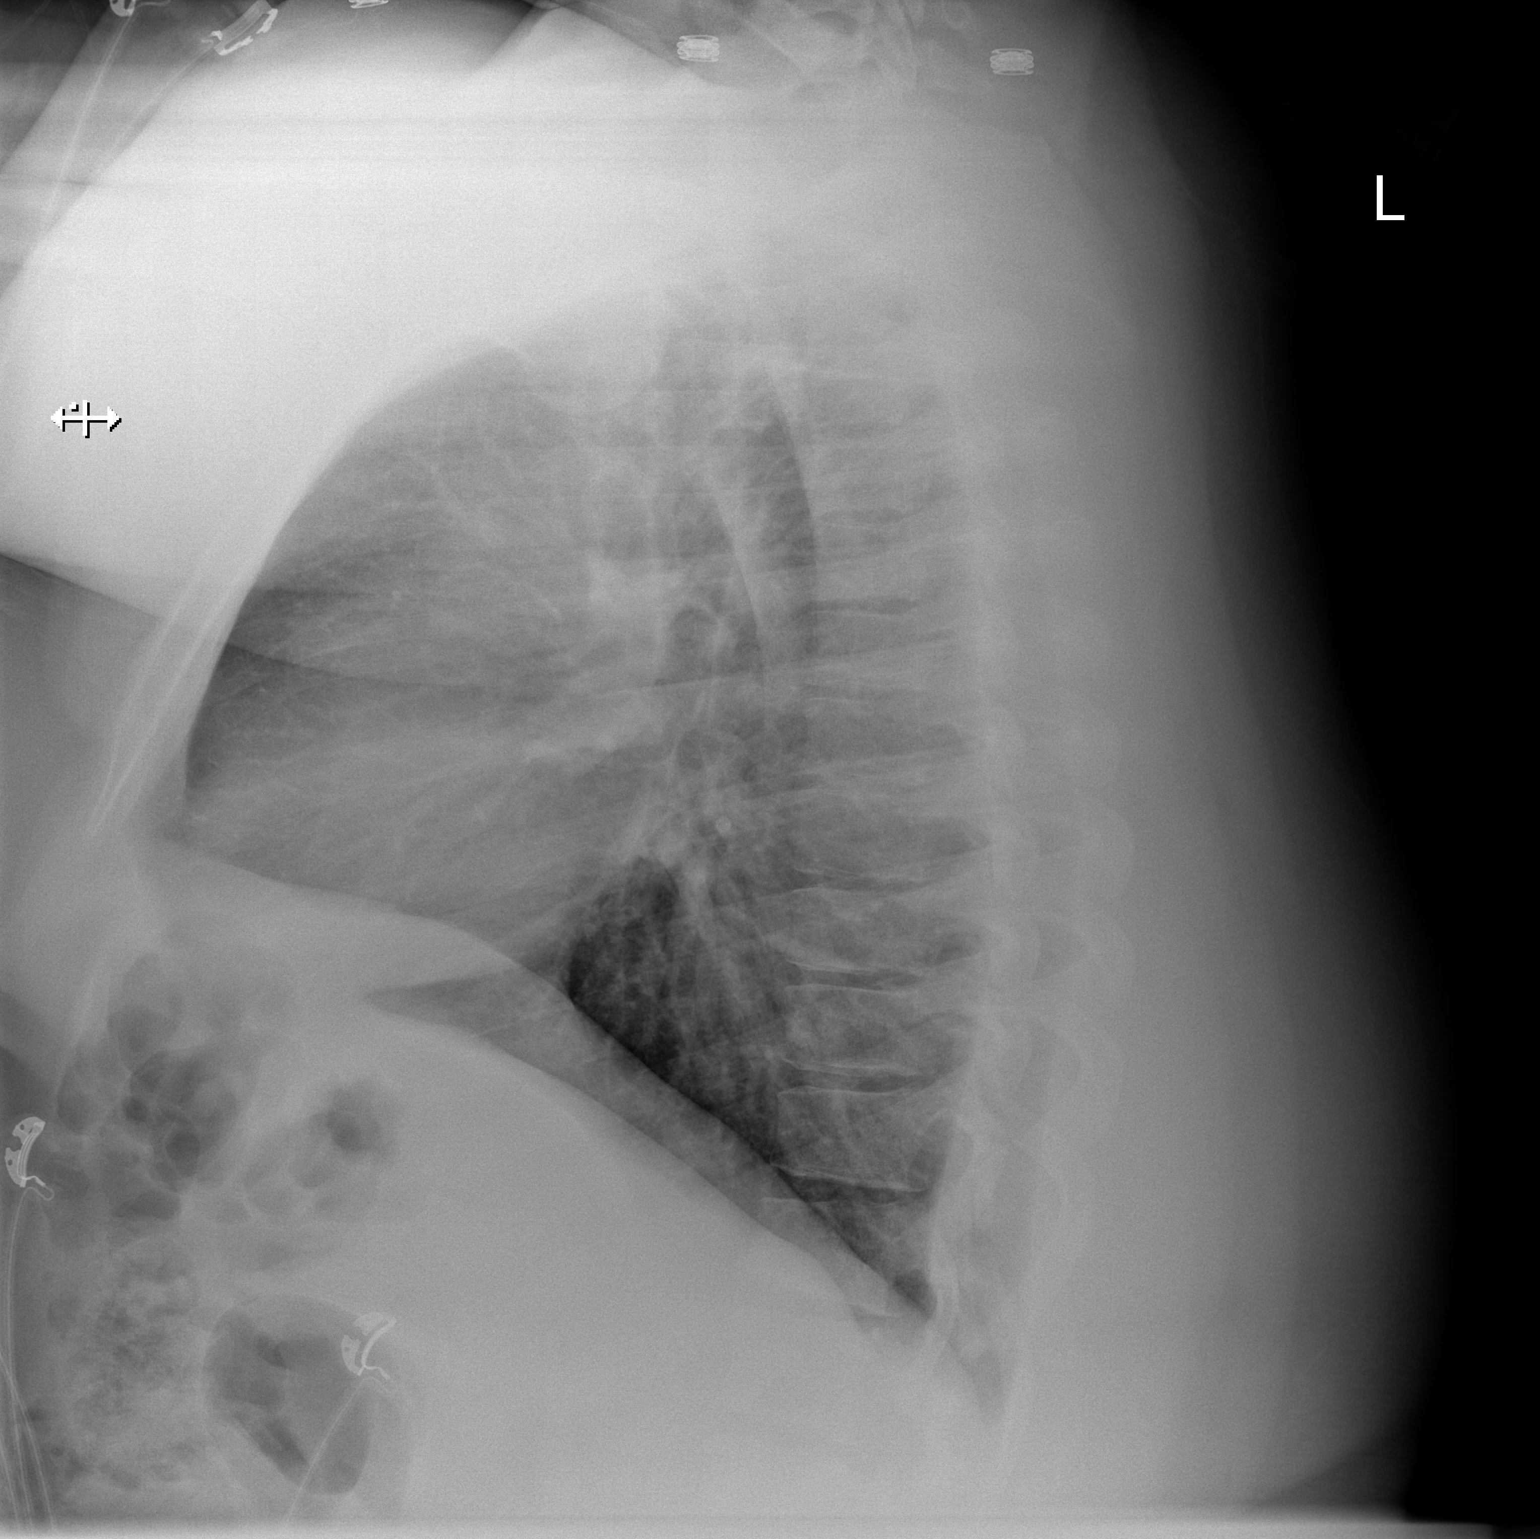

[2 of 2 positions shown; findings below may reference images not displayed]

FINDINGS: EKG leads project over the chest.

Cardiomediastinal contours and hilar structures are normal lungs are
clear. No pneumothorax or sign of pleural effusion.

On limited assessment there is no acute skeletal finding.
IMPRESSION: No active cardiopulmonary disease.
